# Patient Record
Sex: Female | Born: 1960 | Race: White | Hispanic: No | Marital: Married | Smoking: Never smoker
Health system: Southern US, Community
[De-identification: ages and names within clinical notes are randomized; demographics above are authoritative.]

## PROBLEM LIST (undated history)

## (undated) DIAGNOSIS — R011 Cardiac murmur, unspecified: Secondary | ICD-10-CM

## (undated) DIAGNOSIS — C801 Malignant (primary) neoplasm, unspecified: Secondary | ICD-10-CM

## (undated) DIAGNOSIS — G43909 Migraine, unspecified, not intractable, without status migrainosus: Secondary | ICD-10-CM

## (undated) DIAGNOSIS — Q453 Other congenital malformations of pancreas and pancreatic duct: Secondary | ICD-10-CM

## (undated) DIAGNOSIS — K219 Gastro-esophageal reflux disease without esophagitis: Secondary | ICD-10-CM

## (undated) HISTORY — PX: NASAL SINUS SURGERY: SHX719

## (undated) HISTORY — PX: ABDOMINAL HYSTERECTOMY: SHX81

---

## 2003-10-03 ENCOUNTER — Other Ambulatory Visit: Payer: Self-pay

## 2004-04-24 ENCOUNTER — Other Ambulatory Visit: Payer: Self-pay

## 2005-01-12 ENCOUNTER — Ambulatory Visit: Payer: Self-pay | Admitting: Obstetrics and Gynecology

## 2007-05-09 ENCOUNTER — Ambulatory Visit: Payer: Self-pay | Admitting: Obstetrics and Gynecology

## 2007-06-19 ENCOUNTER — Ambulatory Visit: Payer: Self-pay | Admitting: Unknown Physician Specialty

## 2007-09-29 ENCOUNTER — Inpatient Hospital Stay: Payer: Self-pay | Admitting: Internal Medicine

## 2007-09-29 ENCOUNTER — Other Ambulatory Visit: Payer: Self-pay

## 2008-07-02 ENCOUNTER — Ambulatory Visit: Payer: Self-pay | Admitting: Obstetrics and Gynecology

## 2008-07-15 ENCOUNTER — Ambulatory Visit: Payer: Self-pay | Admitting: Obstetrics and Gynecology

## 2008-07-20 ENCOUNTER — Ambulatory Visit: Payer: Self-pay | Admitting: Obstetrics and Gynecology

## 2009-01-26 ENCOUNTER — Inpatient Hospital Stay: Payer: Self-pay | Admitting: Internal Medicine

## 2009-10-05 ENCOUNTER — Ambulatory Visit: Payer: Self-pay | Admitting: Obstetrics and Gynecology

## 2009-10-09 ENCOUNTER — Ambulatory Visit: Payer: Self-pay | Admitting: Internal Medicine

## 2009-11-01 ENCOUNTER — Ambulatory Visit: Payer: Self-pay | Admitting: Unknown Physician Specialty

## 2010-09-09 ENCOUNTER — Ambulatory Visit: Payer: Self-pay | Admitting: Family Medicine

## 2010-09-21 ENCOUNTER — Ambulatory Visit: Payer: Self-pay | Admitting: Unknown Physician Specialty

## 2010-09-24 ENCOUNTER — Emergency Department: Payer: Self-pay | Admitting: Emergency Medicine

## 2010-09-30 ENCOUNTER — Ambulatory Visit: Payer: Self-pay | Admitting: Unknown Physician Specialty

## 2010-10-05 LAB — PATHOLOGY REPORT

## 2010-11-08 ENCOUNTER — Ambulatory Visit: Payer: Self-pay | Admitting: Internal Medicine

## 2010-11-17 ENCOUNTER — Ambulatory Visit: Payer: Self-pay | Admitting: Obstetrics and Gynecology

## 2011-07-11 ENCOUNTER — Ambulatory Visit: Payer: Self-pay | Admitting: Otolaryngology

## 2011-08-08 ENCOUNTER — Ambulatory Visit: Payer: Self-pay

## 2011-08-24 ENCOUNTER — Ambulatory Visit: Payer: Self-pay

## 2011-11-21 ENCOUNTER — Ambulatory Visit: Payer: Self-pay | Admitting: Family Medicine

## 2011-12-03 ENCOUNTER — Emergency Department (HOSPITAL_COMMUNITY)
Admission: EM | Admit: 2011-12-03 | Discharge: 2011-12-04 | Disposition: A | Discharge status: Home / Self Care | Payer: Worker's Compensation | Attending: Emergency Medicine | Admitting: Emergency Medicine

## 2011-12-03 ENCOUNTER — Emergency Department (HOSPITAL_COMMUNITY): Payer: Worker's Compensation

## 2011-12-03 ENCOUNTER — Encounter (HOSPITAL_COMMUNITY): Payer: Self-pay | Admitting: *Deleted

## 2011-12-03 DIAGNOSIS — S52123A Displaced fracture of head of unspecified radius, initial encounter for closed fracture: Secondary | ICD-10-CM | POA: Insufficient documentation

## 2011-12-03 DIAGNOSIS — Y9301 Activity, walking, marching and hiking: Secondary | ICD-10-CM | POA: Insufficient documentation

## 2011-12-03 DIAGNOSIS — Y998 Other external cause status: Secondary | ICD-10-CM | POA: Insufficient documentation

## 2011-12-03 DIAGNOSIS — W010XXA Fall on same level from slipping, tripping and stumbling without subsequent striking against object, initial encounter: Secondary | ICD-10-CM | POA: Insufficient documentation

## 2011-12-03 MED ORDER — OXYCODONE-ACETAMINOPHEN 5-325 MG PO TABS
2.0000 | ORAL_TABLET | Freq: Once | ORAL | Status: DC
  Filled 2011-12-03: qty 2

## 2011-12-03 MED ORDER — HYDROCODONE-ACETAMINOPHEN 5-325 MG PO TABS
2.0000 | ORAL_TABLET | Freq: Once | ORAL | Status: AC
  Administered 2011-12-03: 1 via ORAL
  Filled 2011-12-03: qty 2

## 2011-12-03 NOTE — ED Notes (Signed)
Pt states that she fell this evening when trying to step over an object and she landed on her left arm. Pt states that she has limited movement and that she has pain all the way up to her shoulder. Pt holding arm like sling and has sharp burst of pain when attempting to move. Pt denies hitting her head or passing out or LOC.

## 2011-12-03 NOTE — Progress Notes (Signed)
Orthopedic Tech Progress Note Patient Details:  Margaret Bryan October 13, 1960 [id number]  Other Ortho Devices Ortho Device Interventions: Application         Sling   For     Right  Arm      Irish Lack 12/03/2011, 11:41 PM

## 2011-12-03 NOTE — ED Notes (Signed)
Pt fell on her left elbow.  Pain in left elbow and unable to move.  Mechanical fall

## 2011-12-04 MED ORDER — HYDROCODONE-ACETAMINOPHEN 5-325 MG PO TABS: 2.0000 | ORAL_TABLET | Freq: Once | ORAL | Status: AC

## 2011-12-04 NOTE — ED Provider Notes (Signed)
Medical screening examination/treatment/procedure(s) were performed by non-physician practitioner and as supervising physician I was immediately available for consultation/collaboration.   Glynn Octave, MD 12/04/11 [phone]

## 2011-12-04 NOTE — Discharge Instructions (Signed)
Cast or Splint Care Casts and splints support injured limbs and keep bones from moving while they heal.  HOME CARE  Keep the cast or splint uncovered during the drying period.   A plaster cast can take 24 to 48 hours to dry.   A fiberglass cast will dry in less than 1 hour.   Do not rest the cast on anything harder than a pillow for 24 hours.   Do not put weight on your injured limb. Do not put pressure on the cast. Wait for your doctor's approval.   Keep the cast or splint dry.   Cover the cast or splint with a plastic bag during baths or wet weather.   If you have a cast over your chest and belly (trunk), take sponge baths until the cast is taken off.   Keep your cast or splint clean. Wash a dirty cast with a damp cloth.   Do not put any objects under your cast or splint. Do not scratch the skin under the cast with an object.   Do not take out the padding from inside your cast.   Exercise your joints near the cast as told by your doctor.   Raise (elevate) your injured limb on 1 or 2 pillows for the first 1 to 3 days.  GET HELP RIGHT AWAY IF:  Your cast or splint cracks.   Your cast or splint is too tight or too loose.   You itch badly under the cast.   Your cast gets wet or has a soft spot.   You have a bad smell coming from the cast.   You get an object stuck under the cast.   Your skin around the cast becomes red or raw.   You have new or more pain after the cast is put on.   You have fluid leaking through the cast.   You cannot move your fingers or toes.   Your fingers or toes turn colors or are cool, painful, or puffy (swollen).   You have tingling or lose feeling (numbness) around the injured area.   You have pain or pressure under the cast.   You have trouble breathing or have shortness of breath.   You have chest pain.  MAKE SURE YOU:  Understand these instructions.   Will watch your condition.   Will get help right away if you are not doing  well or get worse.  Document Released: 12/28/2010 Document Revised: 08/17/2011 Document Reviewed: 12/28/2010 [facility] Patient Information 2012 [place], [place].Elbow Fracture, Simple A fracture is a break in one of the bones.When fractures are not displaced or separated, they may be treated with only a sling or splint. The sling or splint may only be required for two to three weeks. In these cases, often the elbow is put through early range of motion exercises to prevent the elbow from getting stiff. DIAGNOSIS  The diagnosis (learning what is wrong) of a fractured elbow is made by x-ray. These may be required before and after the elbow is put into a splint or cast. X-rays are taken after to make sure the bone pieces have not moved. HOME CARE INSTRUCTIONS   Only take over-the-counter or prescription medicines for pain, discomfort, or fever as directed by your caregiver.   If you have a splint held on with an elastic wrap and your hand or fingers become numb or cold and blue, loosen the wrap and reapply more loosely. See your caregiver if there is no relief.  You may use ice for twenty minutes, four times per day, for the first two to three days.   Use your elbow as directed.   See your caregiver as directed. It is very important to keep all follow-up referrals and appointments in order to avoid any long-term problems with your elbow including chronic pain or stiffness.  SEEK IMMEDIATE MEDICAL CARE IF:   There is swelling or increasing pain in elbow.   You begin to lose feeling or experience numbness or tingling in your hand or fingers.   You develop swelling of the hand and fingers.   You get a cold or blue hand or fingers on affected side.  MAKE SURE YOU:   Understand these instructions.   Will watch your condition.   Will get help right away if you are not doing well or get worse.  Document Released: 08/22/2001 Document Revised: 08/17/2011 Document Reviewed: 07/13/2009 [facility]  Patient Information 2012 [place], [place].

## 2011-12-04 NOTE — ED Provider Notes (Signed)
History     CSN: [id number]  Arrival date & time 12/03/11  2125   First MD Initiated Contact with Patient 12/03/11 2335      Chief Complaint  Patient presents with  . Arm Pain    (Consider location/radiation/quality/duration/timing/severity/associated sxs/prior treatment) HPI Comments: Patient here with left elbow pain after tripping over a small object on the floor and landing on left elbow - reports severe pain in the elbow and inability to straighten the elbow.  She report no wrist pain, numbess or tingling.  She is from [place] and states that they went to [place] and were told the wait was 6 hours so they came here.  Patient is a 51 y.o. female presenting with arm pain. The history is provided by the patient. No language interpreter was used.  Arm Pain This is a new problem. The current episode started today. The problem occurs constantly. The problem has been unchanged. Associated symptoms include arthralgias. Pertinent negatives include no abdominal pain, anorexia, change in bowel habit, chest pain, chills, congestion, coughing, diaphoresis, fatigue, fever, headaches, joint swelling, myalgias, nausea, neck pain, numbness, rash, sore throat, swollen glands, urinary symptoms, vertigo, visual change, vomiting or weakness. The symptoms are aggravated by bending. She has tried nothing for the symptoms. The treatment provided no relief.    History reviewed. No pertinent past medical history.  Past Surgical History  Procedure Date  . Abdominal hysterectomy     No family history on file.  History  Substance Use Topics  . Smoking status: Not on file  . Smokeless tobacco: Not on file  . Alcohol Use: No    OB History    Grav Para Term Preterm Abortions TAB SAB Ect Mult Living                  Review of Systems  Constitutional: Negative for fever, chills, diaphoresis and fatigue.  HENT: Negative for congestion, sore throat and neck pain.   Respiratory: Negative for  cough.   Cardiovascular: Negative for chest pain.  Gastrointestinal: Negative for nausea, vomiting, abdominal pain, anorexia and change in bowel habit.  Musculoskeletal: Positive for arthralgias. Negative for myalgias and joint swelling.  Skin: Negative for rash.  Neurological: Negative for vertigo, weakness, numbness and headaches.  All other systems reviewed and are negative.    Allergies  Percocet  Home Medications   Current Outpatient Rx  Name Route Sig Dispense Refill  . VITAMIN D-3 PO Oral Take 1 capsule by mouth daily.    Marland Kitchen ESOMEPRAZOLE MAGNESIUM 40 MG PO CPDR Oral Take 40 mg by mouth daily before breakfast.    . IBUPROFEN 200 MG PO TABS Oral Take 600 mg by mouth every 6 (six) hours as needed. For pain    . TOPIRAMATE 100 MG PO TABS Oral Take 100 mg by mouth daily.      BP 136/79  Pulse 75  Temp(Src) 97.7 F (36.5 C) (Oral)  Resp 18  SpO2 100%  Physical Exam  Nursing note and vitals reviewed. Constitutional: She is oriented to person, place, and time. She appears well-developed and well-nourished. No distress.  HENT:  Head: Normocephalic and atraumatic.  Right Ear: External ear normal.  Left Ear: External ear normal.  Nose: Nose normal.  Mouth/Throat: Oropharynx is clear and moist. No oropharyngeal exudate.  Eyes: Conjunctivae are normal. Pupils are equal, round, and reactive to light. No scleral icterus.  Neck: Normal range of motion. Neck supple.  Cardiovascular: Normal rate, regular rhythm and normal heart sounds.  Exam reveals no gallop and no friction rub.   No murmur heard. Pulmonary/Chest: Breath sounds normal. No respiratory distress. She has no wheezes. She has no rales. She exhibits no tenderness.  Abdominal: Soft. Bowel sounds are normal. She exhibits no distension. There is no tenderness.  Musculoskeletal:       Left elbow: She exhibits decreased range of motion, swelling and effusion. She exhibits no deformity and no laceration. tenderness found.  Radial head tenderness noted.  Lymphadenopathy:    She has no cervical adenopathy.  Neurological: She is alert and oriented to person, place, and time. No cranial nerve deficit.  Skin: Skin is warm and dry. No rash noted. No erythema. No pallor.  Psychiatric: She has a normal mood and affect. Her behavior is normal. Judgment and thought content normal.    ED Course  Procedures (including critical care time)  Labs Reviewed - No data to display Dg Elbow Complete Left  12/03/2011  *RADIOLOGY REPORT*  Clinical Data: Larey Seat on left elbow; left elbow pain and limited range of motion.  LEFT ELBOW - COMPLETE 3+ VIEW  Comparison: None.  Findings: A minimally displaced radial head fracture is suspected, though not well characterized, given the patient's large elbow joint effusion.  No definite distal humeral fracture is characterized.  The joint space is otherwise grossly preserved.  No additional soft tissue abnormalities are seen.  IMPRESSION: Large elbow joint effusion noted; suspect minimally displaced radial head fracture, though this is not well characterized.  Original Report Authenticated By: Tonia Ghent, M.D.     Radial head fracture    MDM  Patient here with radial head fracture, placed in sugar tong splint and sling, she is from [place] and will follow up with an orthopedic surgeon there - given pain control here.        Izola Price [place], [place] 12/04/11 [phone]

## 2012-01-15 ENCOUNTER — Encounter: Payer: Self-pay | Admitting: Orthopedic Surgery

## 2012-02-10 ENCOUNTER — Encounter: Payer: Self-pay | Admitting: Orthopedic Surgery

## 2012-05-26 ENCOUNTER — Ambulatory Visit: Payer: Self-pay | Admitting: Medical

## 2012-08-30 ENCOUNTER — Emergency Department (HOSPITAL_COMMUNITY)
Admission: EM | Admit: 2012-08-30 | Discharge: 2012-08-30 | Disposition: A | Discharge status: Home / Self Care | Payer: No Typology Code available for payment source | Attending: Emergency Medicine | Admitting: Emergency Medicine

## 2012-08-30 ENCOUNTER — Encounter (HOSPITAL_COMMUNITY): Payer: Self-pay

## 2012-08-30 ENCOUNTER — Emergency Department (HOSPITAL_COMMUNITY): Payer: No Typology Code available for payment source

## 2012-08-30 DIAGNOSIS — R0789 Other chest pain: Secondary | ICD-10-CM | POA: Insufficient documentation

## 2012-08-30 DIAGNOSIS — Z79899 Other long term (current) drug therapy: Secondary | ICD-10-CM | POA: Insufficient documentation

## 2012-08-30 DIAGNOSIS — R011 Cardiac murmur, unspecified: Secondary | ICD-10-CM | POA: Insufficient documentation

## 2012-08-30 DIAGNOSIS — G43909 Migraine, unspecified, not intractable, without status migrainosus: Secondary | ICD-10-CM | POA: Insufficient documentation

## 2012-08-30 HISTORY — DX: Migraine, unspecified, not intractable, without status migrainosus: G43.909

## 2012-08-30 HISTORY — DX: Cardiac murmur, unspecified: R01.1

## 2012-08-30 LAB — CBC
Hemoglobin: 13.1 g/dL (ref 12.0–15.0)
MCV: 90.9 fL (ref 78.0–100.0)
Platelets: 220 10*3/uL (ref 150–400)
RBC: 4.38 MIL/uL (ref 3.87–5.11)
WBC: 4.8 10*3/uL (ref 4.0–10.5)

## 2012-08-30 LAB — BASIC METABOLIC PANEL
CO2: 24 mEq/L (ref 19–32)
Chloride: 106 mEq/L (ref 96–112)
Sodium: 140 mEq/L (ref 135–145)

## 2012-08-30 LAB — PRO B NATRIURETIC PEPTIDE: Pro B Natriuretic peptide (BNP): 19.6 pg/mL (ref 0–125)

## 2012-08-30 LAB — TROPONIN I: Troponin I: <0.3 ng/mL (ref <0.30)

## 2012-08-30 LAB — D-DIMER, QUANTITATIVE: D-Dimer, Quant: 0.39 ug/mL-FEU (ref 0.00–0.48)

## 2012-08-30 LAB — POCT I-STAT TROPONIN I: Troponin i, poc: 0 ng/mL (ref 0.00–0.08)

## 2012-08-30 NOTE — ED Notes (Signed)
Pt reports sudden onset of (L) side chest pain radiating into her (L) arm and up into (L) side of her neck, bilateral shoulder aches, SOB w/exertion, and "a lot of belching." pt reports symptoms started after lunch and originally thought it was gas. Pt in nad, describes her pain as a pressure

## 2012-08-30 NOTE — ED Provider Notes (Signed)
History     CSN: [id number]  Arrival date & time 08/30/12  4098   First MD Initiated Contact with Patient 08/30/12 1950      Chief Complaint  Patient presents with  . Chest Pain    (Consider location/radiation/quality/duration/timing/severity/associated sxs/prior treatment) HPI Comments: Patient reports left-sided chest pain onset this afternoon around 2 PM. He comes and goes lasting a few seconds to minutes at a time. It radiates to her left arm. She is not have panic this in the past. She denies any shortness of breath, vomiting, cough or fever. She reports no previous cardiac history and had a negative stress test 2 years ago. She's not smoke. She's does not have hypertension high blood pressure diabetes. She is pain-free right now. Pain is not reproducible. It does not last longer than a couple minutes. It has been coming and going for the past 6 hours however.  The history is provided by the patient and the spouse.    Past Medical History  Diagnosis Date  . Heart murmur   . Migraine     Past Surgical History  Procedure Date  . Abdominal hysterectomy     Family History  Problem Relation Age of Onset  . Heart failure Mother   . Hypertension Mother   . Heart failure Father   . Hypertension Father     History  Substance Use Topics  . Smoking status: Never Smoker   . Smokeless tobacco: Not on file  . Alcohol Use: No    OB History    Grav Para Term Preterm Abortions TAB SAB Ect Mult Living                  Review of Systems  Constitutional: Negative for fever, activity change and appetite change.  HENT: Negative for congestion and rhinorrhea.   Respiratory: Negative for cough and shortness of breath.   Cardiovascular: Positive for chest pain.  Gastrointestinal: Negative for nausea, vomiting and abdominal pain.  Genitourinary: Negative for dysuria, hematuria, vaginal bleeding and vaginal discharge.  Musculoskeletal: Negative for back pain.  Skin: Negative  for rash.  Neurological: Negative for dizziness, weakness and headaches.  A complete 10 system review of systems was obtained and all systems are negative except as noted in the HPI and PMH.    Allergies  Percocet  Home Medications   Current Outpatient Rx  Name  Route  Sig  Dispense  Refill  . TOPIRAMATE 100 MG PO TABS   Oral   Take 100 mg by mouth at bedtime.            BP 98/61  Pulse 67  Temp 97.6 F (36.4 C) (Oral)  Resp 15  SpO2 99%  Physical Exam  Constitutional: She is oriented to person, place, and time. She appears well-developed and well-nourished. No distress.  HENT:  Head: Normocephalic and atraumatic.  Mouth/Throat: Oropharynx is clear and moist. No oropharyngeal exudate.  Eyes: Conjunctivae normal and EOM are normal. Pupils are equal, round, and reactive to light.  Neck: Normal range of motion. Neck supple.  Cardiovascular: Normal rate, regular rhythm and normal heart sounds.   No murmur heard. Pulmonary/Chest: Effort normal and breath sounds normal. No respiratory distress. She exhibits no tenderness.  Abdominal: Soft. There is no tenderness. There is no rebound and no guarding.  Musculoskeletal: Normal range of motion. She exhibits no edema.  Neurological: She is alert and oriented to person, place, and time. No cranial nerve deficit. She exhibits normal muscle tone.  Coordination normal.  Skin: Skin is warm.    ED Course  Procedures (including critical care time)  Labs Reviewed  BASIC METABOLIC PANEL - Abnormal; Notable for the following:    GFR calc non Af Amer 84 (*)     All other components within normal limits  CBC  PRO B NATRIURETIC PEPTIDE  POCT I-STAT TROPONIN I  TROPONIN I  D-DIMER, QUANTITATIVE   Dg Chest 2 View  08/30/2012  *RADIOLOGY REPORT*  Clinical Data: Chest and bilateral arm pain.  CHEST - 2 VIEW  Comparison: None.  Findings: Lungs clear.  Heart size normal.  No pneumothorax or pleural fluid.  IMPRESSION: Negative chest.    Original Report Authenticated By: Holley Dexter, M.D.      No diagnosis found.    MDM  Intermittent chest pain for the past 6-1/2 hours. No shortness of breath, nausea diaphoresis. EKG nonischemic, troponin negative.  Atypical chest pain lasting for a few seconds to minutes at a time that radiates to left arm. No shortness of breath, nausea, diaphoresis or dizziness.  EKG normal sinus rhythm. Troponin negative. D-dimer negative. Delta troponin negative. Patient pain-free at this time.  She is offered CDU chest pain protocol. She rather followup with her doctor for outpatient stress test. I believe this is reasonable with two negative troponins and a normal EKG. Patient and husband in agreement.     Date: 08/30/2012  Rate: 75  Rhythm: normal sinus rhythm  QRS Axis: normal  Intervals: normal  ST/T Wave abnormalities: ST depressions inferiorly  Conduction Disutrbances:none  Narrative Interpretation: Possible slight ST depressions inferiorly  Old EKG Reviewed: none available   Date: 08/30/2012  Rate: 67  Rhythm: normal sinus rhythm  QRS Axis: normal  Intervals: normal  ST/T Wave abnormalities: normal  Conduction Disutrbances:none  Narrative Interpretation:   Old EKG Reviewed: unchanged    Glynn Octave, MD 08/30/12 2321

## 2013-02-14 ENCOUNTER — Ambulatory Visit: Payer: Self-pay | Admitting: Unknown Physician Specialty

## 2013-02-17 LAB — PATHOLOGY REPORT

## 2013-03-05 ENCOUNTER — Ambulatory Visit: Payer: Self-pay

## 2013-04-23 ENCOUNTER — Ambulatory Visit: Payer: Self-pay | Admitting: Family Medicine

## 2013-07-08 ENCOUNTER — Ambulatory Visit: Payer: Self-pay | Admitting: Family Medicine

## 2013-07-08 LAB — URINALYSIS, COMPLETE
Bacteria: NEGATIVE
Bilirubin,UR: NEGATIVE
Glucose,UR: NEGATIVE mg/dL (ref 0–75)
Ketone: NEGATIVE
Nitrite: NEGATIVE
Ph: 7.5 (ref 4.5–8.0)
Protein: NEGATIVE
Specific Gravity: 1.01 (ref 1.003–1.030)

## 2013-07-08 LAB — CBC WITH DIFFERENTIAL/PLATELET
Basophil #: 0 10*3/uL (ref 0.0–0.1)
Basophil %: 0.8 %
Eosinophil #: 0.1 10*3/uL (ref 0.0–0.7)
Eosinophil %: 2.2 %
HCT: 40.7 % (ref 35.0–47.0)
HGB: 13.4 g/dL (ref 12.0–16.0)
Lymphocyte %: 39.1 %
MCV: 91 fL (ref 80–100)
Monocyte #: 0.4 x10 3/mm (ref 0.2–0.9)
Monocyte %: 6.8 %
Platelet: 224 10*3/uL (ref 150–440)
RBC: 4.45 10*6/uL (ref 3.80–5.20)
RDW: 13 % (ref 11.5–14.5)
WBC: 5.5 10*3/uL (ref 3.6–11.0)

## 2013-07-08 LAB — COMPREHENSIVE METABOLIC PANEL WITH GFR
Albumin: 4 g/dL
Alkaline Phosphatase: 106 U/L
Anion Gap: 12
BUN: 17 mg/dL
Bilirubin,Total: 0.3 mg/dL
Calcium, Total: 8.7 mg/dL
Chloride: 104 mmol/L
Co2: 26 mmol/L
Creatinine: 0.81 mg/dL
EGFR (African American): >60
EGFR (Non-African Amer.): >60
Glucose: 94 mg/dL
Osmolality: 284
Potassium: 3.8 mmol/L
SGOT(AST): 17 U/L
SGPT (ALT): 29 U/L
Sodium: 142 mmol/L
Total Protein: 7.4 g/dL

## 2013-07-08 LAB — MONONUCLEOSIS SCREEN: Mono Test: NEGATIVE

## 2013-10-09 ENCOUNTER — Ambulatory Visit: Payer: Self-pay | Admitting: Physician Assistant

## 2013-11-09 ENCOUNTER — Ambulatory Visit: Payer: Self-pay | Admitting: Emergency Medicine

## 2013-11-09 LAB — RAPID STREP-A WITH REFLX: Micro Text Report: NEGATIVE

## 2013-11-12 LAB — BETA STREP CULTURE(ARMC)

## 2013-12-22 IMAGING — CR DG ELBOW COMPLETE 3+V*L*
4 series · 4 of 4 positions shown · non-contrast
Comparison: None.

CLINICAL DATA: Fell on left elbow; left elbow pain and limited
range of motion.

LEFT ELBOW - COMPLETE 3+ VIEW

[x elbow joint obl. left (1 of 2)]
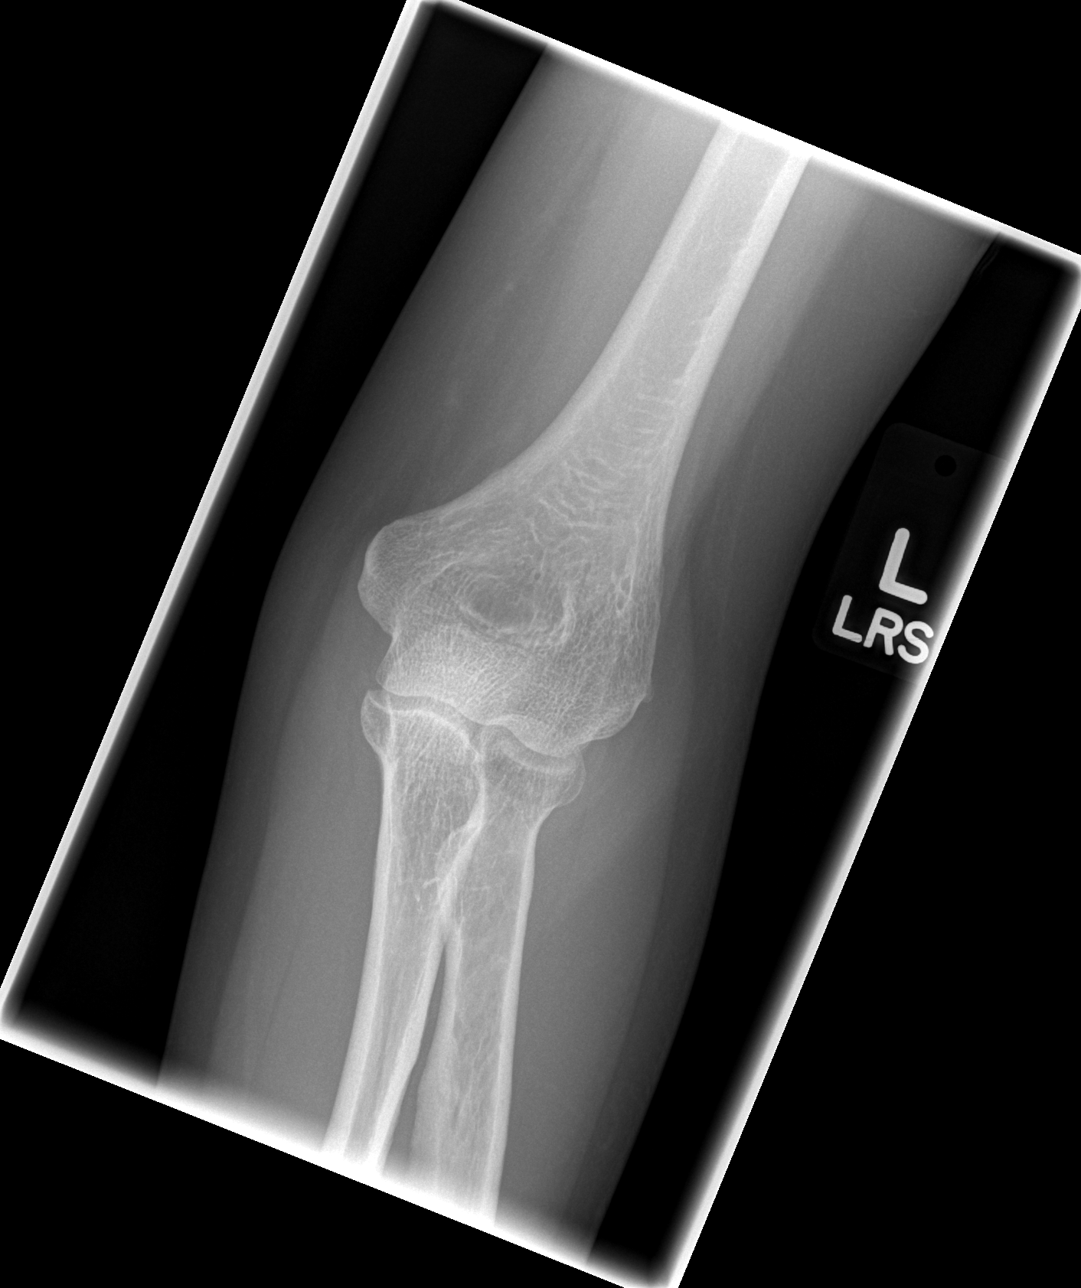

[x elbow joint lat left (1 of 2)]
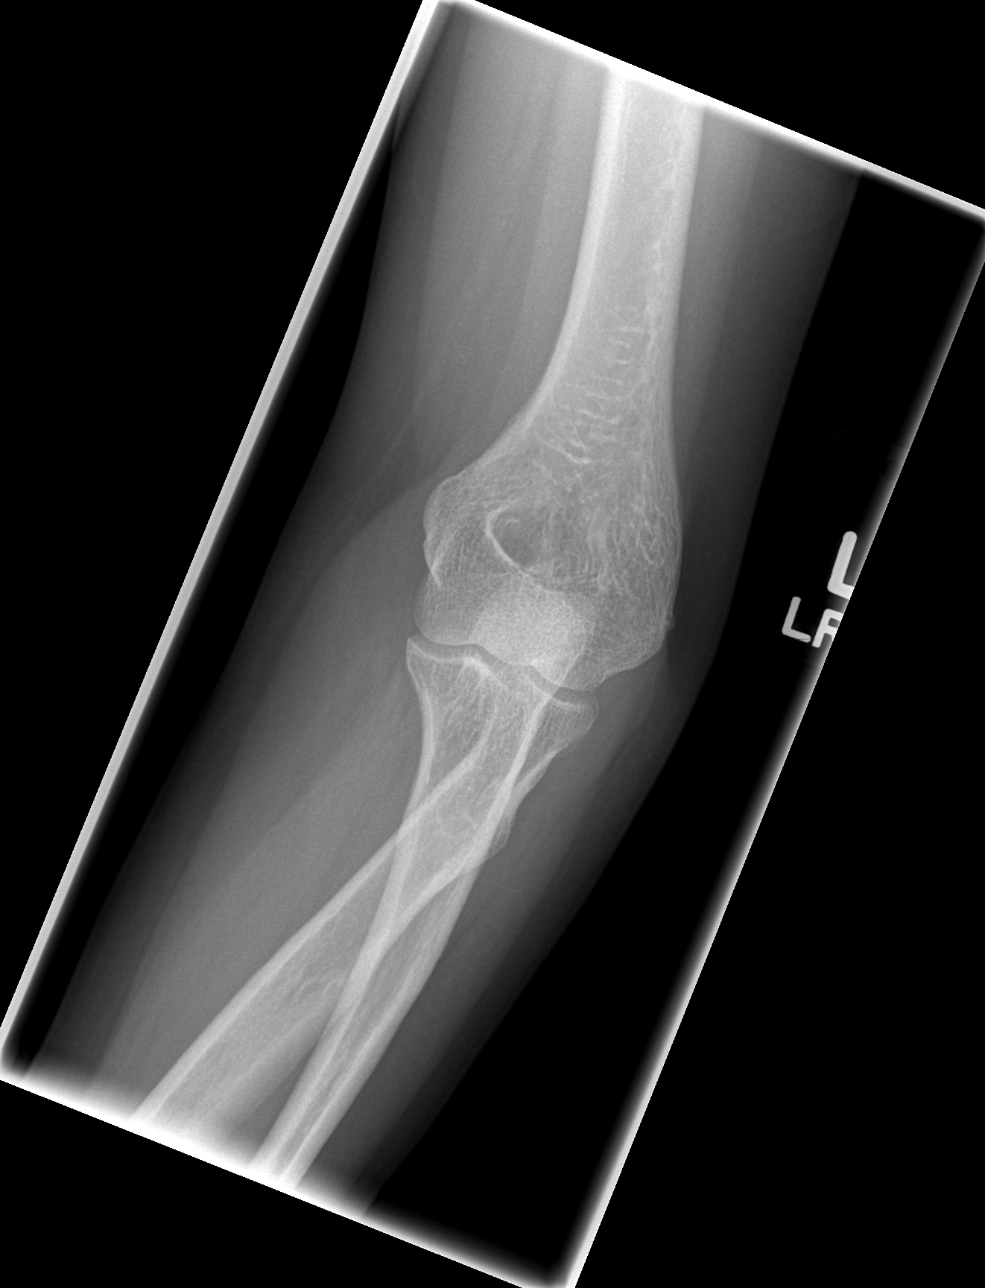

[x elbow joint obl. left (2 of 2)]
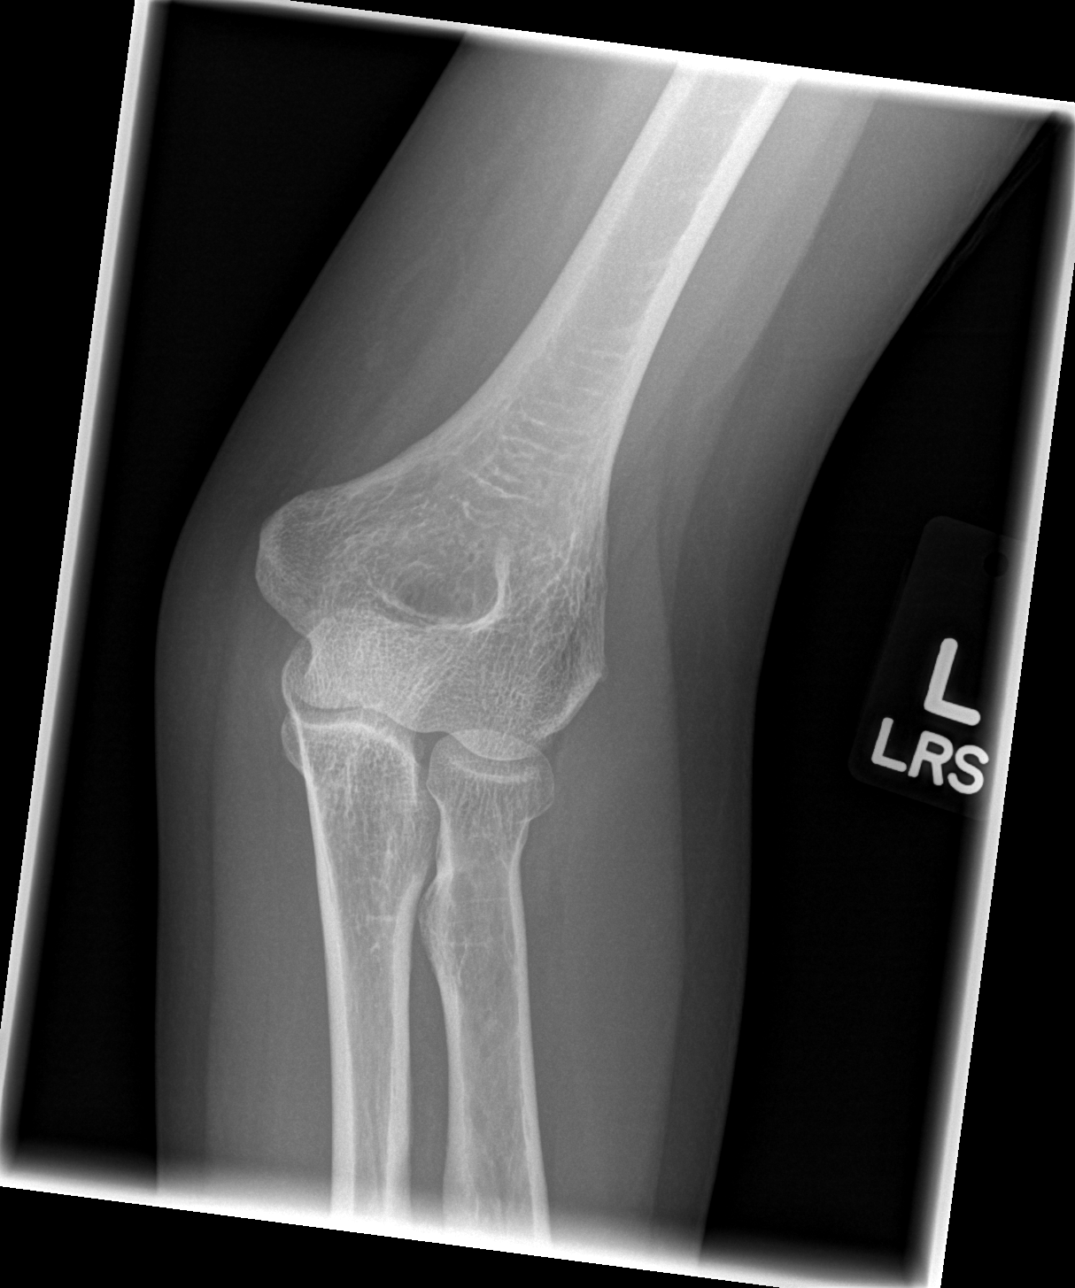

[x elbow joint lat left (2 of 2)]
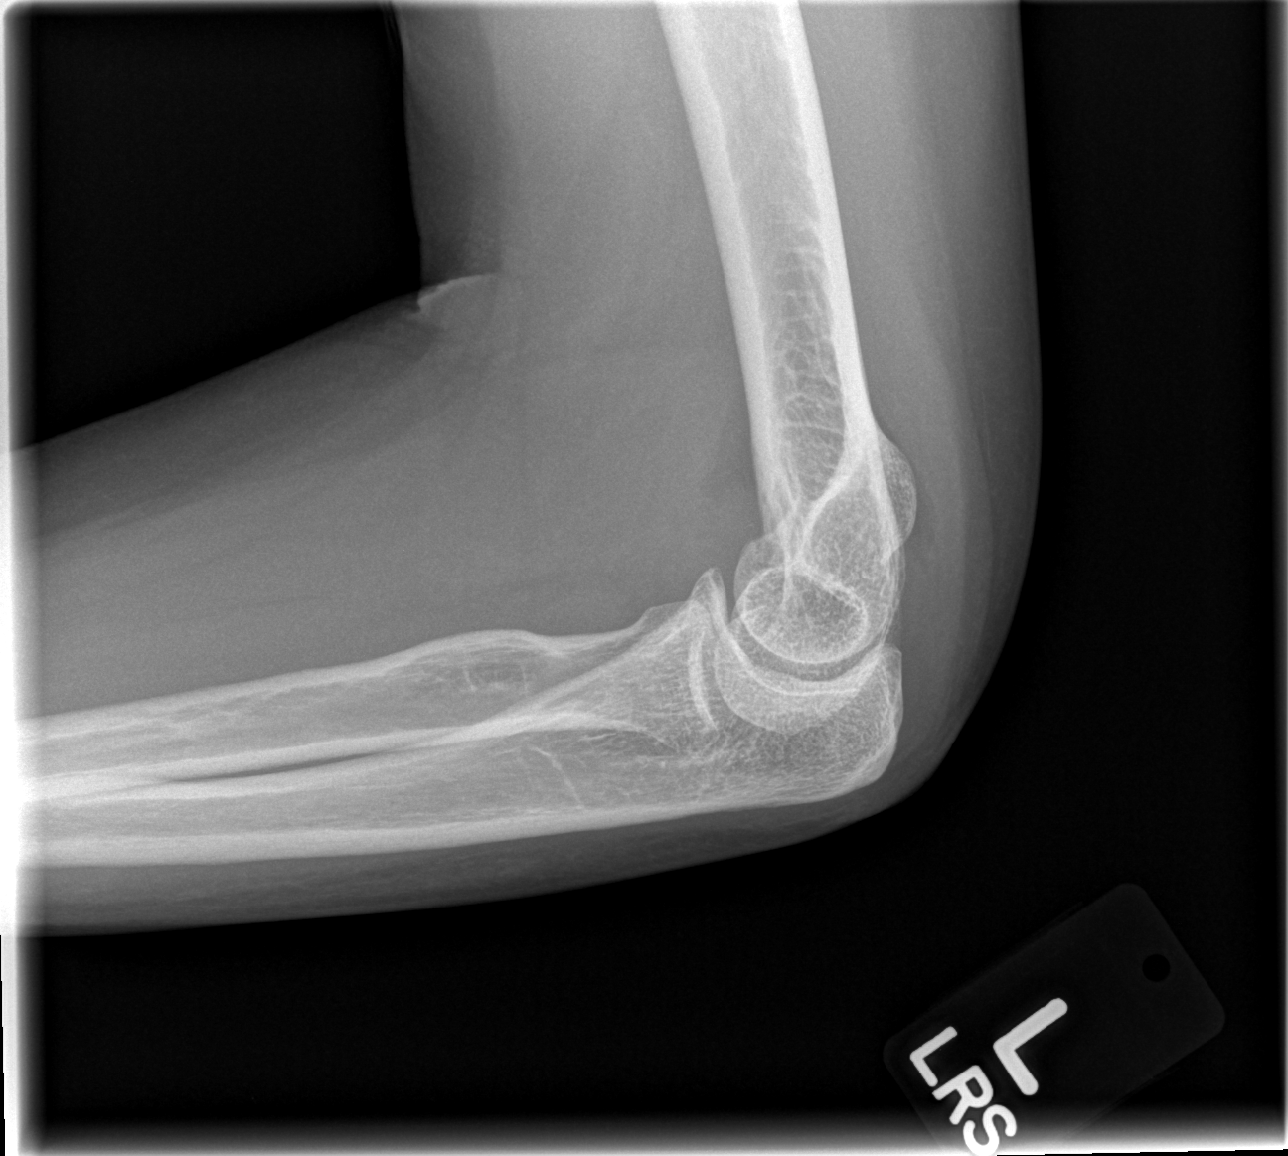

[4 of 4 positions shown; findings below may reference images not displayed]

FINDINGS: A minimally displaced radial head fracture is suspected,
though not well characterized, given the patient's large elbow
joint effusion.  No definite distal humeral fracture is
characterized.  The joint space is otherwise grossly preserved.  No
additional soft tissue abnormalities are seen.
IMPRESSION: Large elbow joint effusion noted; suspect minimally displaced
radial head fracture, though this is not well characterized.

## 2013-12-31 ENCOUNTER — Ambulatory Visit: Payer: Self-pay | Admitting: Otolaryngology

## 2014-05-20 ENCOUNTER — Ambulatory Visit: Payer: Self-pay | Admitting: Family Medicine

## 2014-12-10 ENCOUNTER — Ambulatory Visit: Admit: 2014-12-10 | Disposition: A | Discharge status: Home / Self Care | Payer: Self-pay | Admitting: Neurology

## 2015-02-23 ENCOUNTER — Other Ambulatory Visit: Payer: Self-pay | Admitting: Neurology

## 2015-02-23 DIAGNOSIS — R200 Anesthesia of skin: Secondary | ICD-10-CM

## 2015-02-23 DIAGNOSIS — M542 Cervicalgia: Secondary | ICD-10-CM

## 2015-03-02 ENCOUNTER — Ambulatory Visit
Admission: RE | Admit: 2015-03-02 | Discharge: 2015-03-02 | Disposition: A | Discharge status: Home / Self Care | Payer: No Typology Code available for payment source | Source: Ambulatory Visit | Attending: Neurology | Admitting: Neurology

## 2015-03-02 DIAGNOSIS — R200 Anesthesia of skin: Secondary | ICD-10-CM | POA: Insufficient documentation

## 2015-03-02 DIAGNOSIS — M5032 Other cervical disc degeneration, mid-cervical region: Secondary | ICD-10-CM | POA: Diagnosis not present

## 2015-03-02 DIAGNOSIS — M542 Cervicalgia: Secondary | ICD-10-CM

## 2015-03-02 MED ORDER — GADOBENATE DIMEGLUMINE 529 MG/ML IV SOLN
20.0000 mL | Freq: Once | INTRAVENOUS | Status: AC | PRN
  Administered 2015-03-02: 16 mL via INTRAVENOUS

## 2015-03-16 ENCOUNTER — Other Ambulatory Visit: Payer: Self-pay | Admitting: Family Medicine

## 2015-03-16 DIAGNOSIS — Z1231 Encounter for screening mammogram for malignant neoplasm of breast: Secondary | ICD-10-CM

## 2015-04-11 ENCOUNTER — Ambulatory Visit
Admission: EM | Admit: 2015-04-11 | Discharge: 2015-04-11 | Disposition: A | Discharge status: Home / Self Care | Payer: 59 | Attending: Emergency Medicine | Admitting: Emergency Medicine

## 2015-04-11 DIAGNOSIS — G43009 Migraine without aura, not intractable, without status migrainosus: Secondary | ICD-10-CM

## 2015-04-11 MED ORDER — KETOROLAC TROMETHAMINE 60 MG/2ML IM SOLN
60.0000 mg | Freq: Once | INTRAMUSCULAR | Status: AC
  Administered 2015-04-11: 60 mg via INTRAMUSCULAR

## 2015-04-11 MED ORDER — ONDANSETRON 8 MG PO TBDP: 8.0000 mg | ORAL_TABLET | Freq: Three times a day (TID) | ORAL | Status: DC | PRN

## 2015-04-11 MED ORDER — ONDANSETRON 8 MG PO TBDP
8.0000 mg | ORAL_TABLET | Freq: Once | ORAL | Status: AC
  Administered 2015-04-11: 4 mg via ORAL

## 2015-04-11 NOTE — ED Provider Notes (Signed)
CSN: [id number]     Arrival date & time 04/11/15  1409 History   First MD Initiated Contact with Patient 04/11/15 1447     Chief Complaint  Patient presents with  . Migraine   (Consider location/radiation/quality/duration/timing/severity/associated sxs/prior Treatment) HPI  54 yo F with long history of migraine headaches. Takes Topomax twice daily . This was not effective in relieving her of the headache she woke up with this morning. Has had AM episodes in past. Attempted to eat breakfast but N/V , lost the pancake. Has had dry heaves throughtout the day. Has not been ill, no particular emotional stresses. Weather changes often affect her. Left sided headache, her usual presentation. No visual changes. No neurosensory symptoms. No dizzines or instability. Nausea and vomiting this AM Past Medical History  Diagnosis Date  . Heart murmur   . Migraine    Past Surgical History  Procedure Laterality Date  . Abdominal hysterectomy     Family History  Problem Relation Age of Onset  . Heart failure Mother   . Hypertension Mother   . Heart failure Father   . Hypertension Father    History  Substance Use Topics  . Smoking status: Never Smoker   . Smokeless tobacco: Not on file  . Alcohol Use: No   OB History    No data available     Review of Systems Constitutional: No fever, left sided migraine h/a Eyes: No visual changes. ENT:No sore throat. Cardiovascular:Negative for chest pain/palpitations Respiratory: Negative for shortness of breath Gastrointestinal: No abdominal pain. Has had nausea,vomiting,   No diarrhea Genitourinary: Negative for dysuria. Normal urination. Musculoskeletal: Negative for back pain. FROM extremities without pain Skin: Negative for rash Neurological: Negative for focal weakness or numbness, no balance issues or dizziness  Allergies  Percocet and Percocet  Home Medications   Prior to Admission medications   Medication Sig Start Date End Date Taking?  Authorizing Provider  ondansetron (ZOFRAN-ODT) 8 MG disintegrating tablet Take 1 tablet (8 mg total) by mouth every 8 (eight) hours as needed for nausea or vomiting. 04/11/15   Jan Fireman, PA-C  topiramate (TOPAMAX) 100 MG tablet Take 100 mg by mouth at bedtime.     Historical Provider, MD   BP 144/88 mmHg  Pulse 65  Temp(Src) 98.1 F (36.7 C) (Oral)  Resp 18  Ht 5\' 8"  (1.727 m)  Wt 179 lb (81.194 kg)  BMI 27.22 kg/m2  SpO2 100% Physical Exam   Constitutional -alert and oriented,nausea, left sided headache. "morning headaches are harder for me to break" Head-atraumatic, normocephalic Eyes- conjunctiva normal, EOMI ,conjugate gaze, PERRLA Nose- no congestion or rhinorrhea Mouth/throat- mucous membranes moist , Neck- supple without glandular enlargement CV- regular rate, grossly normal heart sounds, G2 SEM Resp-no distress, normal respiratory effort,clear to auscultation bilaterally GI- soft,non-tender,no distention GU-  not examined MSK- normal ROM, all extremities, ambulatory, self-care Neuro- normal speech and language, no gross focal neurological deficit appreciated, no gait instability,CNs as tested WNL Skin-warm,dry ,intact; no rash noted Psych-mood and affect grossly normal; speech and behavior grossly normal ED Course  Procedures (including critical care time) Labs Review Labs Reviewed - No data to display  Imaging Review No results found.  Medications  ketorolac (TORADOL) injection 60 mg (60 mg Intramuscular Given 04/11/15 1500)  ondansetron (ZOFRAN-ODT) disintegrating tablet 8 mg (4 mg Oral Given 04/11/15 1500)   Half hour later with improved headache and no more nausea Accepted ice chips, then Coke without recurrent nausea  An hour post Rx  reports feeling greatly improved, headache almost completely gone, requests discharge home No dizziness or neurosensory findings, denies visual changes. MDM   1. Migraine without aura and without status migrainosus, not  intractable    Diagnosis and treatment discussed.Will submit nausea Rx for at home availability. Headaches are longstanding and Topomax Rx maintained . H/A not completely gone at discharge but greatly improved and will go home to rest. Discourage TV, computer and phone screens. Hydrate.  Questions fielded, expectations and recommendations reviewed. Patient expresses understanding. Will return to [facility] with questions, concern or exacerbation.     Jan Fireman, PA-C 04/13/15 [phone]

## 2015-04-11 NOTE — ED Notes (Signed)
MigrainE, woke up this AM with severe headache on left side, no visual changes, Perrl, neuro checks unremarkable, nausea and vomiting present.

## 2015-04-13 ENCOUNTER — Encounter: Payer: Self-pay | Admitting: Physician Assistant

## 2015-05-25 ENCOUNTER — Ambulatory Visit: Payer: No Typology Code available for payment source

## 2015-06-01 ENCOUNTER — Ambulatory Visit
Admission: RE | Admit: 2015-06-01 | Discharge: 2015-06-01 | Disposition: A | Discharge status: Home / Self Care | Payer: 59 | Source: Ambulatory Visit | Attending: Family Medicine | Admitting: Family Medicine

## 2015-06-01 DIAGNOSIS — Z1231 Encounter for screening mammogram for malignant neoplasm of breast: Secondary | ICD-10-CM

## 2016-03-22 ENCOUNTER — Other Ambulatory Visit: Payer: Self-pay | Admitting: Family Medicine

## 2016-03-22 DIAGNOSIS — Z1231 Encounter for screening mammogram for malignant neoplasm of breast: Secondary | ICD-10-CM

## 2016-06-01 ENCOUNTER — Ambulatory Visit
Admission: RE | Admit: 2016-06-01 | Discharge: 2016-06-01 | Disposition: A | Discharge status: Home / Self Care | Payer: 59 | Source: Ambulatory Visit | Attending: Family Medicine | Admitting: Family Medicine

## 2016-06-01 ENCOUNTER — Other Ambulatory Visit: Payer: Self-pay | Admitting: Family Medicine

## 2016-06-01 DIAGNOSIS — Z1231 Encounter for screening mammogram for malignant neoplasm of breast: Secondary | ICD-10-CM

## 2016-07-18 ENCOUNTER — Emergency Department
Admission: EM | Admit: 2016-07-18 | Discharge: 2016-07-18 | Disposition: A | Discharge status: Home / Self Care | Payer: 59 | Attending: Emergency Medicine | Admitting: Emergency Medicine

## 2016-07-18 ENCOUNTER — Emergency Department: Payer: 59

## 2016-07-18 ENCOUNTER — Encounter: Payer: Self-pay | Admitting: Emergency Medicine

## 2016-07-18 DIAGNOSIS — R0789 Other chest pain: Secondary | ICD-10-CM | POA: Insufficient documentation

## 2016-07-18 DIAGNOSIS — Z79899 Other long term (current) drug therapy: Secondary | ICD-10-CM | POA: Insufficient documentation

## 2016-07-18 DIAGNOSIS — R002 Palpitations: Secondary | ICD-10-CM | POA: Insufficient documentation

## 2016-07-18 LAB — CBC
HEMATOCRIT: 39.5 % (ref 35.0–47.0)
HEMOGLOBIN: 13.4 g/dL (ref 12.0–16.0)
MCH: 30.4 pg (ref 26.0–34.0)
MCHC: 33.8 g/dL (ref 32.0–36.0)
MCV: 89.9 fL (ref 80.0–100.0)
Platelets: 228 10*3/uL (ref 150–440)
RBC: 4.4 MIL/uL (ref 3.80–5.20)
RDW: 13 % (ref 11.5–14.5)
WBC: 5.2 10*3/uL (ref 3.6–11.0)

## 2016-07-18 LAB — BASIC METABOLIC PANEL
ANION GAP: 6 (ref 5–15)
BUN: 13 mg/dL (ref 6–20)
CHLORIDE: 104 mmol/L (ref 101–111)
CO2: 29 mmol/L (ref 22–32)
Calcium: 8.8 mg/dL — ABNORMAL LOW (ref 8.9–10.3)
Creatinine, Ser: 0.78 mg/dL (ref 0.44–1.00)
GFR calc Af Amer: >60 mL/min (ref >60)
GFR calc non Af Amer: >60 mL/min (ref >60)
GLUCOSE: 107 mg/dL — AB (ref 65–99)
POTASSIUM: 3.9 mmol/L (ref 3.5–5.1)
Sodium: 139 mmol/L (ref 135–145)

## 2016-07-18 LAB — TROPONIN I: Troponin I: <0.03 ng/mL (ref <0.03)

## 2016-07-18 NOTE — ED Triage Notes (Signed)
Pt reports nausea and chest pressure since last night. States she was nauseous before going to bed and when she laid down she felt like her "heart was racing." Also reports intermittent episodes of shortness of breath, lightheadedness and right jaw pain. Pt denies any personal cardiac history but reports both parents with significant cardiac history.

## 2016-07-18 NOTE — ED Provider Notes (Signed)
-----------------------------------------   8:34 PM on 07/18/2016 -----------------------------------------  I forgot to discharge the patient with a prescription for Zofran as I told her I would.  Our secretary is going to call in a prescription for her for the following: Zofran ODT tablets.  Allow 1 tablet to dissolve in your mouth every 6-8 hours as needed for nausea or vomiting.  Dispensed 30 tablets.  No refills.   Hinda Kehr, MD 07/18/16 2034

## 2016-07-18 NOTE — ED Provider Notes (Signed)
[facility] Emergency Department Provider Note  ____________________________________________   First MD Initiated Contact with Patient 07/18/16 1547     (approximate)  I have reviewed the triage vital signs and the nursing notes.   HISTORY  Chief Complaint Chest Pain and Nausea    HPI Margaret Bryan is a 55 y.o. female with a past medical history of palpitations years ago but no other personal cardiac history who has seen Dr. Nehemiah Massed in the past.  She presents complaining of intermittent episodes of racing heart palpitations since late last night.  They are accompanied with chest tightness, shortness of breath, lightheadedness, and some right jaw pain.  Currently she is not having any of those symptoms except possibly for some persistent chest tightness.  She also had nausea which is intermittent.  Overall she describes the symptoms as moderate to severe.  It feels similar but worse than the episodes that she had years ago for which she saw Dr. Nehemiah Massed and had a Holter monitor placed with no definitive diagnosis provided.  She has no personal history of coronary artery disease although both of her parents had significant coronary artery disease and myocardial infarctions.  She does not smoke, have high blood pressure, hyperlipidemia, or diabetes.  She is generally healthy other than finishing a course of antibiotics about a week and a half ago for mild sinus infection.  Nothing in particular made her symptoms worse or better and they resolved after about 15 minutes.   Past Medical History:  Diagnosis Date  . Heart murmur   . Migraine     There are no active problems to display for this patient.   Past Surgical History:  Procedure Laterality Date  . ABDOMINAL HYSTERECTOMY    . NASAL SINUS SURGERY      Prior to Admission medications   Medication Sig Start Date End Date Taking? Authorizing Provider  ondansetron (ZOFRAN-ODT) 8 MG disintegrating tablet Take  1 tablet (8 mg total) by mouth every 8 (eight) hours as needed for nausea or vomiting. 04/11/15   Jan Fireman, PA-C  topiramate (TOPAMAX) 100 MG tablet Take 100 mg by mouth at bedtime.     Historical Provider, MD    Allergies Percocet [oxycodone-acetaminophen] and Percocet [oxycodone-acetaminophen]  Family History  Problem Relation Age of Onset  . Heart failure Mother   . Hypertension Mother   . Heart failure Father   . Hypertension Father   . Breast cancer Maternal Aunt 35  . Breast cancer Maternal Grandmother 41  . Breast cancer Cousin 20    Social History Social History  Substance Use Topics  . Smoking status: Never Smoker  . Smokeless tobacco: Never Used  . Alcohol use No    Review of Systems Constitutional: No fever/chills Eyes: No visual changes. ENT: No sore throat. Cardiovascular: Chest tightness associated with racing heart palpitations, intermittent episodes since last night Respiratory: Denies shortness of breath. Gastrointestinal: No abdominal pain.  nausea, no vomiting.  No diarrhea.  No constipation. Genitourinary: Negative for dysuria. Musculoskeletal: Negative for back pain. Skin: Negative for rash. Neurological: Negative for headaches, focal weakness or numbness.  10-point ROS otherwise negative.  ____________________________________________   PHYSICAL EXAM:  VITAL SIGNS: ED Triage Vitals  Enc Vitals Group     BP 07/18/16 1356 (!) 148/77     Pulse Rate 07/18/16 1356 71     Resp 07/18/16 1356 16     Temp 07/18/16 1356 98.1 F (36.7 C)     Temp Source 07/18/16  1356 Oral     SpO2 07/18/16 1356 97 %     Weight 07/18/16 1357 210 lb (95.3 kg)     Height 07/18/16 1357 5\' 8"  (1.727 m)     Head Circumference --      Peak Flow --      Pain Score 07/18/16 1357 6     Pain Loc --      Pain Edu? --      Excl. in [place]? --     Constitutional: Alert and oriented. Well appearing and in no acute distress. Eyes: Conjunctivae are normal. PERRL. EOMI. Head:  Atraumatic. Nose: No congestion/rhinnorhea. Mouth/Throat: Mucous membranes are moist.  Oropharynx non-erythematous. Neck: No stridor.  No meningeal signs.   Cardiovascular: Normal rate, regular rhythm. Good peripheral circulation. Grossly normal heart sounds. Respiratory: Normal respiratory effort.  No retractions. Lungs CTAB. Gastrointestinal: Soft and nontender. No distention.  Musculoskeletal: No lower extremity tenderness nor edema. No gross deformities of extremities. Neurologic:  Normal speech and language. No gross focal neurologic deficits are appreciated.  Skin:  Skin is warm, dry and intact. No rash noted. Psychiatric: Mood and affect are normal. Speech and behavior are normal.  ____________________________________________   LABS (all labs ordered are listed, but only abnormal results are displayed)  Labs Reviewed  BASIC METABOLIC PANEL - Abnormal; Notable for the following:       Result Value   Glucose, Bld 107 (*)    Calcium 8.8 (*)    All other components within normal limits  CBC  TROPONIN I   ____________________________________________  EKG  ED ECG REPORT I, Perry Molla, the attending physician, personally viewed and interpreted this ECG.  Date: 07/18/2016 EKG Time: 13:49 Rate: 29 Rhythm: normal sinus rhythm QRS Axis: normal Intervals: normal ST/T Wave abnormalities: normal Conduction Disturbances: none Narrative Interpretation: unremarkable  ____________________________________________  RADIOLOGY   Dg Chest 2 View  Result Date: 07/18/2016 CLINICAL DATA:  Shortness of breath. Left-sided mandibular region pain EXAM: CHEST  2 VIEW COMPARISON:  August 30, 2012 FINDINGS: There is no edema or consolidation. Heart size and pulmonary vascularity are normal. No adenopathy. No bone lesions. No pneumothorax. IMPRESSION: No edema or consolidation. Electronically Signed   By: Lowella Grip III M.D.   On: 07/18/2016 14:58     ____________________________________________   PROCEDURES  Procedure(s) performed:   Procedures   Critical Care performed: No ____________________________________________   INITIAL IMPRESSION / ASSESSMENT AND PLAN / ED COURSE  Pertinent labs & imaging results that were available during my care of the patient were reviewed by me and considered in my medical decision making (see chart for details).  HEART score 2, Wells score for PE is zero.  Suspect a benign etiology such as SVT although the patient does not report any recent increase in caffeine intake or similar stimulants.  She has a negative troponin and has been symptomatic since last night intermittently so I do not feel there is a benefit to a repeat troponin.  She has low risk for negative outcomes associated with ACS and her history of present illness, signs, symptoms, and evaluation is reassuring that she does not have any other emergent or life-threatening cause of her symptoms.  She is comfortable with the plan to follow up with Dr. Nehemiah Massed.  She takes a daily baby aspirin and I do not feel is necessary to give her a full dose aspirin at this time.  I encouraged her to call either today or tomorrow to schedule the next available appointment  and she plans to do so.  I gave my usual and customary return precautions.    ____________________________________________  FINAL CLINICAL IMPRESSION(S) / ED DIAGNOSES  Final diagnoses:  Chest discomfort  Heart palpitations     MEDICATIONS GIVEN DURING THIS VISIT:  Medications - No data to display   NEW OUTPATIENT MEDICATIONS STARTED DURING THIS VISIT:  New Prescriptions   No medications on file    Modified Medications   No medications on file    Discontinued Medications   No medications on file     Note:  This document was prepared using Dragon voice recognition software and may include unintentional dictation errors.    Hinda Kehr, MD 07/18/16 [phone]

## 2016-07-18 NOTE — ED Notes (Signed)
Pt ambulatory to lobby. NAD noted at this time.

## 2016-07-18 NOTE — Discharge Instructions (Signed)

## 2016-10-13 ENCOUNTER — Ambulatory Visit: Payer: Self-pay | Admitting: Urology

## 2016-11-09 ENCOUNTER — Ambulatory Visit
Admission: EM | Admit: 2016-11-09 | Discharge: 2016-11-09 | Disposition: A | Discharge status: Home / Self Care | Payer: Managed Care, Other (non HMO) | Attending: Family Medicine | Admitting: Family Medicine

## 2016-11-09 DIAGNOSIS — J029 Acute pharyngitis, unspecified: Secondary | ICD-10-CM | POA: Diagnosis not present

## 2016-11-09 DIAGNOSIS — R590 Localized enlarged lymph nodes: Secondary | ICD-10-CM

## 2016-11-09 LAB — RAPID STREP SCREEN (MED CTR MEBANE ONLY): Streptococcus, Group A Screen (Direct): NEGATIVE

## 2016-11-09 MED ORDER — FEXOFENADINE-PSEUDOEPHED ER 180-240 MG PO TB24: 1.0000 | ORAL_TABLET | Freq: Every day | ORAL | 0 refills | Status: DC

## 2016-11-09 NOTE — ED Triage Notes (Signed)
Pt c/o sore throat, sore neck on the right side and very fatigued.

## 2016-11-09 NOTE — ED Provider Notes (Signed)
[facility]    CSN: [id number] Arrival date & time: 11/09/16  0846     History   Chief Complaint Chief Complaint  Patient presents with  . Sore Throat    HPI Margaret Bryan is a 56 y.o. female.   Patient is here because of sore throat started on Tuesday. She states last week husband had the flu and now he's had sinus infection she's worried that she's caught something from him. She's also concerned about swelling underneath her neck and along the right ear as well. She does not smoke. No known drug allergies she's had abdominal hysterectomy nasal septal surgery and she's had a D&C before as well. She has a past history of heart murmur migraine before the past.   The history is provided by the patient. No language interpreter was used.  Sore Throat  This is a new problem. The problem occurs constantly. Pertinent negatives include no chest pain, no abdominal pain, no headaches and no shortness of breath. Nothing aggravates the symptoms. Nothing relieves the symptoms. She has tried nothing for the symptoms. The treatment provided no relief.    Past Medical History:  Diagnosis Date  . Heart murmur   . Migraine     There are no active problems to display for this patient.   Past Surgical History:  Procedure Laterality Date  . ABDOMINAL HYSTERECTOMY    . NASAL SINUS SURGERY      OB History    No data available       Home Medications    Prior to Admission medications   Medication Sig Start Date End Date Taking? Authorizing Provider  fexofenadine-pseudoephedrine (ALLEGRA-D ALLERGY & CONGESTION) 180-240 MG 24 hr tablet Take 1 tablet by mouth daily. 11/09/16   Frederich Cha, MD    Family History Family History  Problem Relation Age of Onset  . Heart failure Mother   . Hypertension Mother   . Heart failure Father   . Hypertension Father   . Breast cancer Maternal Aunt 35  . Breast cancer Maternal Grandmother 69  . Breast cancer Cousin 20    Social  History Social History  Substance Use Topics  . Smoking status: Never Smoker  . Smokeless tobacco: Never Used  . Alcohol use No     Allergies   Imitrex [sumatriptan]; Percocet [oxycodone-acetaminophen]; and Percocet [oxycodone-acetaminophen]   Review of Systems Review of Systems  HENT: Positive for congestion, facial swelling and sneezing.   Respiratory: Negative for shortness of breath.   Cardiovascular: Negative for chest pain.  Gastrointestinal: Negative for abdominal pain.  Neurological: Negative for headaches.  All other systems reviewed and are negative.    Physical Exam Triage Vital Signs ED Triage Vitals  Enc Vitals Group     BP 11/09/16 0904 139/85     Pulse Rate 11/09/16 0904 66     Resp 11/09/16 0904 18     Temp 11/09/16 0904 97.7 F (36.5 C)     Temp Source 11/09/16 0904 Oral     SpO2 11/09/16 0904 100 %     Weight 11/09/16 0903 212 lb (96.2 kg)     Height 11/09/16 0903 5\' 8"  (1.727 m)     Head Circumference --      Peak Flow --      Pain Score 11/09/16 0903 7     Pain Loc --      Pain Edu? --      Excl. in [place]? --    No data found.  Updated Vital Signs BP 139/85 (BP Location: Left Arm)   Pulse 66   Temp 97.7 F (36.5 C) (Oral)   Resp 18   Ht 5\' 8"  (1.727 m)   Wt 212 lb (96.2 kg)   SpO2 100%   BMI 32.23 kg/m   Visual Acuity Right Eye Distance:   Left Eye Distance:   Bilateral Distance:    Right Eye Near:   Left Eye Near:    Bilateral Near:     Physical Exam  Constitutional: She appears well-developed and well-nourished.  HENT:  Head: Normocephalic and atraumatic.  Right Ear: External ear normal.  Left Ear: External ear normal.  Nose: Mucosal edema and rhinorrhea present. Right sinus exhibits no maxillary sinus tenderness and no frontal sinus tenderness. Left sinus exhibits no maxillary sinus tenderness and no frontal sinus tenderness.  Mouth/Throat: Uvula is midline. No uvula swelling. Posterior oropharyngeal erythema present.   Eyes: Pupils are equal, round, and reactive to light.  Neck: Trachea normal and normal range of motion. Neck supple. No thyromegaly present.    Patient has some anterior cervical nodes and some posterior cervical nodes this was the swelling that she had behind her right ear  Pulmonary/Chest: Breath sounds normal.  Musculoskeletal: Normal range of motion.  Neurological: She is alert.  Skin: Skin is warm.  Psychiatric: She has a normal mood and affect.  Vitals reviewed.    UC Treatments / Results  Labs (all labs ordered are listed, but only abnormal results are displayed) Labs Reviewed  RAPID STREP SCREEN (NOT AT [facility])  CULTURE, GROUP A STREP [facility])    EKG  EKG Interpretation None       Radiology No results found.  Procedures Procedures (including critical care time)  Medications Ordered in UC Medications - No data to display   Initial Impression / Assessment and Plan / UC Course  I have reviewed the triage vital signs and the nursing notes.  Pertinent labs & imaging results that were available during my care of the patient were reviewed by me and considered in my medical decision making (see chart for details).     Patient with some anterior adenopathy also some posterior adenopathy behind the right ear but nothing out of the ordinary. This time until this is all viral) gargle with salt water Allegra-D 1 tablet daily follow-up with PCP in a week not better and will notify her strep test is positive and within her antibiotics at that time. Final Clinical Impressions(s) / UC Diagnoses   Final diagnoses:  Acute pharyngitis, unspecified etiology  Lymphadenopathy of right cervical region    New Prescriptions Current Discharge Medication List    START taking these medications   Details  fexofenadine-pseudoephedrine (ALLEGRA-D ALLERGY & CONGESTION) 180-240 MG 24 hr tablet Take 1 tablet by mouth daily. Qty: 30 tablet, Refills: 0         Note: This dictation  was prepared with Dragon dictation along with smaller phrase technology. Any transcriptional errors that result from this process are unintentional.   Frederich Cha, MD 11/09/16 1006

## 2016-11-11 LAB — CULTURE, GROUP A STREP (THRC)

## 2017-03-19 ENCOUNTER — Other Ambulatory Visit: Payer: Self-pay | Admitting: Family Medicine

## 2017-03-19 DIAGNOSIS — Z1231 Encounter for screening mammogram for malignant neoplasm of breast: Secondary | ICD-10-CM

## 2017-04-17 ENCOUNTER — Ambulatory Visit
Admission: RE | Admit: 2017-04-17 | Discharge: 2017-04-17 | Disposition: A | Discharge status: Home / Self Care | Payer: PRIVATE HEALTH INSURANCE | Source: Ambulatory Visit | Attending: Family Medicine | Admitting: Family Medicine

## 2017-04-17 DIAGNOSIS — Z1231 Encounter for screening mammogram for malignant neoplasm of breast: Secondary | ICD-10-CM | POA: Diagnosis not present

## 2017-07-19 ENCOUNTER — Other Ambulatory Visit: Payer: Self-pay | Admitting: Student

## 2017-07-19 DIAGNOSIS — R1310 Dysphagia, unspecified: Secondary | ICD-10-CM

## 2017-07-24 ENCOUNTER — Ambulatory Visit: Payer: Managed Care, Other (non HMO)

## 2017-07-30 ENCOUNTER — Ambulatory Visit
Admission: RE | Admit: 2017-07-30 | Discharge: 2017-07-30 | Disposition: A | Discharge status: Home / Self Care | Payer: PRIVATE HEALTH INSURANCE | Source: Ambulatory Visit | Attending: Student | Admitting: Student

## 2017-07-30 DIAGNOSIS — K224 Dyskinesia of esophagus: Secondary | ICD-10-CM | POA: Insufficient documentation

## 2017-07-30 DIAGNOSIS — R1310 Dysphagia, unspecified: Secondary | ICD-10-CM | POA: Diagnosis not present

## 2017-12-03 ENCOUNTER — Ambulatory Visit (INDEPENDENT_AMBULATORY_CARE_PROVIDER_SITE_OTHER): Payer: PRIVATE HEALTH INSURANCE | Admitting: Certified Nurse Midwife

## 2017-12-03 ENCOUNTER — Encounter: Payer: Self-pay | Admitting: Certified Nurse Midwife

## 2017-12-03 VITALS — BP 117/75 | HR 78 | Ht 68.0 in | Wt 191.1 lb

## 2017-12-03 DIAGNOSIS — Z01419 Encounter for gynecological examination (general) (routine) without abnormal findings: Secondary | ICD-10-CM | POA: Diagnosis not present

## 2017-12-03 DIAGNOSIS — Z124 Encounter for screening for malignant neoplasm of cervix: Secondary | ICD-10-CM | POA: Diagnosis not present

## 2017-12-03 MED ORDER — NITROFURANTOIN MONOHYD MACRO 100 MG PO CAPS: 100.0000 mg | ORAL_CAPSULE | Freq: Every day | ORAL | 5 refills | Status: DC

## 2017-12-03 NOTE — Progress Notes (Signed)
ANNUAL PREVENTATIVE CARE GYN  ENCOUNTER NOTE  Subjective:       Margaret Bryan is a 57 y.o. G3P3 female here for a routine annual gynecologic exam.  Current complaints: 1. Needs Pap smear per insurance.   Denies difficulty breathing or respiratory distress, chest pain, abdominal pain, vaginal bleeding, dysuria, and leg pain or swelling.   Takes Macrobid after sex to prevent UTI, needs refill.    Gynecologic History  No LMP recorded. Patient has had a hysterectomy.  Contraception: status post hysterectomy  Last Pap: 2015. Results were: normal  Last mammogram: 04/2017. Results were: normal  Last colonoscopy:   Obstetric History  OB History  Gravida Para Term Preterm AB Living  3 3          SAB TAB Ectopic Multiple Live Births               # Outcome Date GA Lbr Len/2nd Weight Sex Delivery Anes PTL Lv  3 Para 1995    F Vag-Spont     2 Para 1992    F Vag-Spont     1 Para 1989    F Vag-Spont  N     Past Medical History:  Diagnosis Date  . Heart murmur   . Migraine     Past Surgical History:  Procedure Laterality Date  . ABDOMINAL HYSTERECTOMY    . NASAL SINUS SURGERY      Current Outpatient Medications on File Prior to Visit  Medication Sig Dispense Refill  . eletriptan (RELPAX) 20 MG tablet Take 20 mg by mouth as needed for migraine or headache. May repeat in 2 hours if headache persists or recurs.    . fexofenadine-pseudoephedrine (ALLEGRA-D ALLERGY & CONGESTION) 180-240 MG 24 hr tablet Take 1 tablet by mouth daily. 30 tablet 0  . nitrofurantoin, macrocrystal-monohydrate, (MACROBID) 100 MG capsule Take 100 mg by mouth 2 (two) times daily.    Marland Kitchen omeprazole (PRILOSEC) 40 MG capsule Take 40 mg by mouth daily.    Marland Kitchen topiramate (TOPAMAX) 100 MG tablet Take 100 mg by mouth 2 (two) times daily.     No current facility-administered medications on file prior to visit.     Allergies  Allergen Reactions  . Imitrex [Sumatriptan]   . Percocet [Oxycodone-Acetaminophen] Other  (See Comments)    Hallucinations, rapid heartbeat  . Percocet [Oxycodone-Acetaminophen]     Social History   Socioeconomic History  . Marital status: Married    Spouse name: Not on file  . Number of children: Not on file  . Years of education: Not on file  . Highest education level: Not on file  Occupational History  . Not on file  Social Needs  . Financial resource strain: Not on file  . Food insecurity:    Worry: Not on file    Inability: Not on file  . Transportation needs:    Medical: Not on file    Non-medical: Not on file  Tobacco Use  . Smoking status: Never Smoker  . Smokeless tobacco: Never Used  Substance and Sexual Activity  . Alcohol use: No  . Drug use: Never  . Sexual activity: Yes  Lifestyle  . Physical activity:    Days per week: Not on file    Minutes per session: Not on file  . Stress: Not on file  Relationships  . Social connections:    Talks on phone: Not on file    Gets together: Not on file    Attends religious service: Not on  file    Active member of club or organization: Not on file    Attends meetings of clubs or organizations: Not on file    Relationship status: Not on file  . Intimate partner violence:    Fear of current or ex partner: Not on file    Emotionally abused: Not on file    Physically abused: Not on file    Forced sexual activity: Not on file  Other Topics Concern  . Not on file  Social History Narrative  . Not on file    Family History  Problem Relation Age of Onset  . Heart failure Mother   . Hypertension Mother   . Heart failure Father   . Hypertension Father   . Breast cancer Maternal Aunt 35  . Breast cancer Maternal Grandmother 53  . Breast cancer Cousin 86    The following portions of the patient's history were reviewed and updated as appropriate: allergies, current medications, past family history, past medical history, past social history, past surgical history and problem list.  Review of Systems  ROS  negative except as noted above. Information obtained from patient.    Objective:   BP 117/75   Pulse 78   Ht 5\' 8"  (1.727 m)   Wt 191 lb 1.6 oz (86.7 kg)   BMI 29.06 kg/m    CONSTITUTIONAL: Well-developed, well-nourished female in no acute distress.   PSYCHIATRIC: Normal mood and affect. Normal behavior. Normal judgment and thought content.  [place]: Alert and oriented to person, place, and time. Normal muscle tone coordination. No cranial nerve deficit noted.  HENT:  Normocephalic, atraumatic, External right and left ear normal.   EYES: Conjunctivae and EOM are normal. Pupils are equal and  round. No scleral icterus.   NECK: Normal range of motion, supple, no masses.  Normal thyroid.   SKIN: Skin is warm and dry. No rash noted. Not diaphoretic. No erythema. No pallor.  CARDIOVASCULAR: Normal heart rate noted, regular rhythm, no murmur.  RESPIRATORY: Clear to auscultation bilaterally. Effort and breath sounds normal, no problems with respiration noted.  BREASTS: Symmetric in size. No masses, skin changes, nipple drainage, or lymphadenopathy.  ABDOMEN: Soft, normal bowel sounds, no distention noted.  No tenderness, rebound or guarding.   PELVIC:  External Genitalia: Normal  Vagina: Normal  Cervix: Normal  Uterus: Surgically absent  Adnexa: Surgically absent   MUSCULOSKELETAL: Normal range of motion. No tenderness.  No cyanosis, clubbing, or edema.  2+ distal pulses.  LYMPHATIC: No Axillary, Supraclavicular, or Inguinal Adenopathy.  Assessment:   Annual gynecologic examination 57 y.o.   Contraception: status post hysterectomy   Normal BMI   Problem List Items Addressed This Visit    None    Visit Diagnoses    Well woman exam with routine gynecological exam    -  Primary   Relevant Orders   PapIG, HPV, rfx 16/18   Screening for cervical cancer       Relevant Orders   PapIG, HPV, rfx 16/18      Plan:   Pap: Pap Co Test.  Mammogram: Not  Indicated.  Labs: By PCP.  Rx: Macrobid, see orders.   Routine preventative health maintenance measures emphasized: Exercise/Diet/Weight control, Alcohol/Substance use risks and Stress Management.  Reviewed red flag symptoms and when to call.  RTC x 1-5 years for Annual Exam or sooner if needed.    Diona Fanti, CNM [facility], [facility]

## 2017-12-03 NOTE — Patient Instructions (Addendum)
Nitrofurantoin tablets or capsules What is this medicine? NITROFURANTOIN (nye troe fyoor AN toyn) is an antibiotic. It is used to treat urinary tract infections. This medicine may be used for other purposes; ask your health care provider or pharmacist if you have questions. COMMON BRAND NAME(S): Macrobid, Macrodantin, Urotoin What should I tell my health care provider before I take this medicine? They need to know if you have any of these conditions: -anemia -diabetes -glucose-6-phosphate dehydrogenase deficiency -kidney disease -liver disease -lung disease -other chronic illness -an unusual or allergic reaction to nitrofurantoin, other antibiotics, other medicines, foods, dyes or preservatives -pregnant or trying to get pregnant -breast-feeding How should I use this medicine? Take this medicine by mouth with a glass of water. Follow the directions on the prescription label. Take this medicine with food or milk. Take your doses at regular intervals. Do not take your medicine more often than directed. Do not stop taking except on your doctor's advice. Talk to your pediatrician regarding the use of this medicine in children. While this drug may be prescribed for selected conditions, precautions do apply. Overdosage: If you think you have taken too much of this medicine contact a poison control center or emergency room at once. NOTE: This medicine is only for you. Do not share this medicine with others. What if I miss a dose? If you miss a dose, take it as soon as you can. If it is almost time for your next dose, take only that dose. Do not take double or extra doses. What may interact with this medicine? -antacids containing magnesium trisilicate -probenecid -quinolone antibiotics like ciprofloxacin, lomefloxacin, norfloxacin and ofloxacin -sulfinpyrazone This list may not describe all possible interactions. Give your health care provider a list of all the medicines, herbs,  non-prescription drugs, or dietary supplements you use. Also tell them if you smoke, drink alcohol, or use illegal drugs. Some items may interact with your medicine. What should I watch for while using this medicine? Tell your doctor or health care professional if your symptoms do not improve or if you get new symptoms. Drink several glasses of water a day. If you are taking this medicine for a long time, visit your doctor for regular checks on your progress. If you are diabetic, you may get a false positive result for sugar in your urine with certain brands of urine tests. Check with your doctor. What side effects may I notice from receiving this medicine? Side effects that you should report to your doctor or health care professional as soon as possible: -allergic reactions like skin rash or hives, swelling of the face, lips, or tongue -chest pain -cough -difficulty breathing -dizziness, drowsiness -fever or infection -joint aches or pains -pale or blue-tinted skin -redness, blistering, peeling or loosening of the skin, including inside the mouth -tingling, burning, pain, or numbness in hands or feet -unusual bleeding or bruising -unusually weak or tired -yellowing of eyes or skin Side effects that usually do not require medical attention (report to your doctor or health care professional if they continue or are bothersome): -dark urine -diarrhea -headache -loss of appetite -nausea or vomiting -temporary hair loss This list may not describe all possible side effects. Call your doctor for medical advice about side effects. You may report side effects to FDA at 1-800-FDA-1088. Where should I keep my medicine? Keep out of the reach of children. Store at room temperature between 15 and 30 degrees C (59 and 86 degrees F). Protect from light. Throw away any unused  medicine after the expiration date. NOTE: This sheet is a summary. It may not cover all possible information. If you have  questions about this medicine, talk to your doctor, pharmacist, or health care provider.  2018 Elsevier/Gold Standard (2008-03-18 15:56:47) Preventive Care 40-64 Years, Female Preventive care refers to lifestyle choices and visits with your health care provider that can promote health and wellness. What does preventive care include?  A yearly physical exam. This is also called an annual well check.  Dental exams once or twice a year.  Routine eye exams. Ask your health care provider how often you should have your eyes checked.  Personal lifestyle choices, including: ? Daily care of your teeth and gums. ? Regular physical activity. ? Eating a healthy diet. ? Avoiding tobacco and drug use. ? Limiting alcohol use. ? Practicing safe sex. ? Taking low-dose aspirin daily starting at age 31. ? Taking vitamin and mineral supplements as recommended by your health care provider. What happens during an annual well check? The services and screenings done by your health care provider during your annual well check will depend on your age, overall health, lifestyle risk factors, and family history of disease. Counseling Your health care provider may ask you questions about your:  Alcohol use.  Tobacco use.  Drug use.  Emotional well-being.  Home and relationship well-being.  Sexual activity.  Eating habits.  Work and work Statistician.  Method of birth control.  Menstrual cycle.  Pregnancy history.  Screening You may have the following tests or measurements:  Height, weight, and BMI.  Blood pressure.  Lipid and cholesterol levels. These may be checked every 5 years, or more frequently if you are over 64 years old.  Skin check.  Lung cancer screening. You may have this screening every year starting at age 34 if you have a 30-pack-year history of smoking and currently smoke or have quit within the past 15 years.  Fecal occult blood test (FOBT) of the stool. You may have  this test every year starting at age 43.  Flexible sigmoidoscopy or colonoscopy. You may have a sigmoidoscopy every 5 years or a colonoscopy every 10 years starting at age 28.  Hepatitis C blood test.  Hepatitis B blood test.  Sexually transmitted disease (STD) testing.  Diabetes screening. This is done by checking your blood sugar (glucose) after you have not eaten for a while (fasting). You may have this done every 1-3 years.  Mammogram. This may be done every 1-2 years. Talk to your health care provider about when you should start having regular mammograms. This may depend on whether you have a family history of breast cancer.  BRCA-related cancer screening. This may be done if you have a family history of breast, ovarian, tubal, or peritoneal cancers.  Pelvic exam and Pap test. This may be done every 3 years starting at age 3. Starting at age 23, this may be done every 5 years if you have a Pap test in combination with an HPV test.  Bone density scan. This is done to screen for osteoporosis. You may have this scan if you are at high risk for osteoporosis.  Discuss your test results, treatment options, and if necessary, the need for more tests with your health care provider. Vaccines Your health care provider may recommend certain vaccines, such as:  Influenza vaccine. This is recommended every year.  Tetanus, diphtheria, and acellular pertussis (Tdap, Td) vaccine. You may need a Td booster every 10 years.  Varicella vaccine.  You may need this if you have not been vaccinated.  Zoster vaccine. You may need this after age 75.  Measles, mumps, and rubella (MMR) vaccine. You may need at least one dose of MMR if you were born in 1957 or later. You may also need a second dose.  Pneumococcal 13-valent conjugate (PCV13) vaccine. You may need this if you have certain conditions and were not previously vaccinated.  Pneumococcal polysaccharide (PPSV23) vaccine. You may need one or two  doses if you smoke cigarettes or if you have certain conditions.  Meningococcal vaccine. You may need this if you have certain conditions.  Hepatitis A vaccine. You may need this if you have certain conditions or if you travel or work in places where you may be exposed to hepatitis A.  Hepatitis B vaccine. You may need this if you have certain conditions or if you travel or work in places where you may be exposed to hepatitis B.  Haemophilus influenzae type b (Hib) vaccine. You may need this if you have certain conditions.  Talk to your health care provider about which screenings and vaccines you need and how often you need them. This information is not intended to replace advice given to you by your health care provider. Make sure you discuss any questions you have with your health care provider. Document Released: 09/24/2015 Document Revised: 05/17/2016 Document Reviewed: 06/29/2015 Elsevier Interactive Patient Education  [employer].

## 2017-12-06 ENCOUNTER — Encounter: Payer: Self-pay | Admitting: Certified Nurse Midwife

## 2017-12-06 LAB — PAPIG, HPV, RFX 16/18
HPV, high-risk: NEGATIVE
PAP SMEAR COMMENT: 0

## 2017-12-07 ENCOUNTER — Encounter: Payer: PRIVATE HEALTH INSURANCE | Admitting: Certified Nurse Midwife

## 2018-01-21 ENCOUNTER — Encounter: Payer: Self-pay | Admitting: Emergency Medicine

## 2018-01-21 ENCOUNTER — Other Ambulatory Visit: Payer: Self-pay

## 2018-01-21 ENCOUNTER — Ambulatory Visit
Admission: EM | Admit: 2018-01-21 | Discharge: 2018-01-21 | Disposition: A | Discharge status: Home / Self Care | Payer: No Typology Code available for payment source | Attending: Family Medicine | Admitting: Family Medicine

## 2018-01-21 DIAGNOSIS — B9789 Other viral agents as the cause of diseases classified elsewhere: Secondary | ICD-10-CM | POA: Diagnosis not present

## 2018-01-21 DIAGNOSIS — J069 Acute upper respiratory infection, unspecified: Secondary | ICD-10-CM

## 2018-01-21 DIAGNOSIS — R05 Cough: Secondary | ICD-10-CM

## 2018-01-21 MED ORDER — HYDROCOD POLST-CPM POLST ER 10-8 MG/5ML PO SUER: 5.0000 mL | Freq: Every evening | ORAL | 0 refills | Status: DC | PRN

## 2018-01-21 NOTE — ED Triage Notes (Signed)
Patient c/o sinus pressure, nasal congestion, ear pressure and cough that started on Saturday.

## 2018-01-21 NOTE — ED Provider Notes (Signed)
[facility]    CSN: [id number] Arrival date & time: 01/21/18  6144     History   Chief Complaint Chief Complaint  Patient presents with  . Cough  . Nasal Congestion  . Sinus Problem    HPI Margaret Bryan is a 57 y.o. female.   The history is provided by the patient.  Cough  Associated symptoms: rhinorrhea   Associated symptoms: no headaches and no wheezing   Sinus Problem  Pertinent negatives include no headaches.  URI  Presenting symptoms: congestion, cough and rhinorrhea   Severity:  Moderate Onset quality:  Sudden Duration:  1 day Timing:  Constant Progression:  Worsening Chronicity:  New Relieved by:  None tried Ineffective treatments:  None tried Associated symptoms: no headaches, no sinus pain and no wheezing   Risk factors: sick contacts   Risk factors: not elderly, no chronic cardiac disease, no chronic kidney disease, no chronic respiratory disease, no diabetes mellitus, no immunosuppression, no recent illness and no recent travel     Past Medical History:  Diagnosis Date  . Heart murmur   . Migraine     There are no active problems to display for this patient.   Past Surgical History:  Procedure Laterality Date  . ABDOMINAL HYSTERECTOMY    . NASAL SINUS SURGERY      OB History    Gravida  3   Para  3   Term      Preterm      AB      Living        SAB      TAB      Ectopic      Multiple      Live Births               Home Medications    Prior to Admission medications   Medication Sig Start Date End Date Taking? Authorizing Provider  eletriptan (RELPAX) 20 MG tablet Take 20 mg by mouth as needed for migraine or headache. May repeat in 2 hours if headache persists or recurs.   Yes [provider]  fexofenadine-pseudoephedrine (ALLEGRA-D ALLERGY & CONGESTION) 180-240 MG 24 hr tablet Take 1 tablet by mouth daily. 11/09/16  Yes Frederich Cha, MD  omeprazole (PRILOSEC) 40 MG capsule Take 40 mg by mouth  daily.   Yes [provider]  topiramate (TOPAMAX) 100 MG tablet Take 100 mg by mouth 2 (two) times daily.   Yes [provider]  chlorpheniramine-HYDROcodone (TUSSIONEX PENNKINETIC ER) 10-8 MG/5ML SUER Take 5 mLs by mouth at bedtime as needed. 01/21/18   Norval Gable, MD  nitrofurantoin, macrocrystal-monohydrate, (MACROBID) 100 MG capsule Take 100 mg by mouth 2 (two) times daily.    [provider]  nitrofurantoin, macrocrystal-monohydrate, (MACROBID) 100 MG capsule Take 1 capsule (100 mg total) by mouth at bedtime. 12/03/17   Lawhorn, Lara Mulch, CNM    Family History Family History  Problem Relation Age of Onset  . Heart failure Mother   . Hypertension Mother   . Heart failure Father   . Hypertension Father   . Breast cancer Maternal Aunt 35  . Breast cancer Maternal Grandmother 59  . Breast cancer Cousin 20    Social History Social History   Tobacco Use  . Smoking status: Never Smoker  . Smokeless tobacco: Never Used  Substance Use Topics  . Alcohol use: No  . Drug use: Never     Allergies   Imitrex [sumatriptan]; Percocet [oxycodone-acetaminophen]; and  Percocet [oxycodone-acetaminophen]   Review of Systems Review of Systems  HENT: Positive for congestion and rhinorrhea. Negative for sinus pain.   Respiratory: Positive for cough. Negative for wheezing.   Neurological: Negative for headaches.     Physical Exam Triage Vital Signs ED Triage Vitals  Enc Vitals Group     BP 01/21/18 0929 136/80     Pulse Rate 01/21/18 0929 85     Resp 01/21/18 0929 14     Temp 01/21/18 0929 98.3 F (36.8 C)     Temp Source 01/21/18 0929 Oral     SpO2 01/21/18 0929 98 %     Weight 01/21/18 0926 185 lb (83.9 kg)     Height 01/21/18 0926 5\' 8"  (1.727 m)     Head Circumference --      Peak Flow --      Pain Score 01/21/18 0926 7     Pain Loc --      Pain Edu? --      Excl. in [place]? --    No data found.  Updated Vital Signs BP 136/80 (BP  Location: Left Arm)   Pulse 85   Temp 98.3 F (36.8 C) (Oral)   Resp 14   Ht 5\' 8"  (1.727 m)   Wt 185 lb (83.9 kg)   SpO2 98%   BMI 28.13 kg/m   Visual Acuity Right Eye Distance:   Left Eye Distance:   Bilateral Distance:    Right Eye Near:   Left Eye Near:    Bilateral Near:     Physical Exam  Constitutional: She appears well-developed and well-nourished. No distress.  HENT:  Head: Normocephalic and atraumatic.  Right Ear: Tympanic membrane, external ear and ear canal normal.  Left Ear: Tympanic membrane, external ear and ear canal normal.  Nose: Rhinorrhea present. No mucosal edema, nose lacerations, sinus tenderness, nasal deformity, septal deviation or nasal septal hematoma. No epistaxis.  No foreign bodies. Right sinus exhibits no maxillary sinus tenderness and no frontal sinus tenderness. Left sinus exhibits no maxillary sinus tenderness and no frontal sinus tenderness.  Mouth/Throat: Uvula is midline, oropharynx is clear and moist and mucous membranes are normal. No oropharyngeal exudate.  Eyes: Conjunctivae are normal. Right eye exhibits no discharge. Left eye exhibits no discharge. No scleral icterus.  Neck: Normal range of motion. Neck supple. No thyromegaly present.  Cardiovascular: Normal rate, regular rhythm and normal heart sounds.  Pulmonary/Chest: Effort normal and breath sounds normal. No stridor. No respiratory distress. She has no wheezes. She has no rales.  Lymphadenopathy:    She has no cervical adenopathy.  Skin: She is not diaphoretic.  Nursing note and vitals reviewed.    UC Treatments / Results  Labs (all labs ordered are listed, but only abnormal results are displayed) Labs Reviewed - No data to display  EKG None  Radiology No results found.  Procedures Procedures (including critical care time)  Medications Ordered in UC Medications - No data to display  Initial Impression / Assessment and Plan / UC Course  I have reviewed the triage  vital signs and the nursing notes.  Pertinent labs & imaging results that were available during my care of the patient were reviewed by me and considered in my medical decision making (see chart for details).      Final Clinical Impressions(s) / UC Diagnoses   Final diagnoses:  Viral URI with cough   Discharge Instructions   None    ED Prescriptions    Medication  Sig Dispense Auth. Provider   chlorpheniramine-HYDROcodone (TUSSIONEX PENNKINETIC ER) 10-8 MG/5ML SUER Take 5 mLs by mouth at bedtime as needed. 60 mL Norval Gable, MD     1. diagnosis reviewed with patient 2. rx as per orders above; reviewed possible side effects, interactions, risks and benefits  3. Recommend supportive treatment with rest, fluids, otc analgesics prn 4. Follow-up prn if symptoms worsen or don't improve   Controlled Substance Prescriptions [place] Controlled Substance Registry consulted? Not Applicable   Norval Gable, MD 01/21/18 [id number]

## 2018-02-18 ENCOUNTER — Encounter: Payer: PRIVATE HEALTH INSURANCE | Admitting: Certified Nurse Midwife

## 2018-02-22 ENCOUNTER — Encounter: Payer: PRIVATE HEALTH INSURANCE | Admitting: Certified Nurse Midwife

## 2018-03-22 ENCOUNTER — Ambulatory Visit (INDEPENDENT_AMBULATORY_CARE_PROVIDER_SITE_OTHER): Payer: PRIVATE HEALTH INSURANCE | Admitting: Certified Nurse Midwife

## 2018-03-22 ENCOUNTER — Other Ambulatory Visit (HOSPITAL_COMMUNITY)
Admission: RE | Admit: 2018-03-22 | Discharge: 2018-03-22 | Disposition: A | Discharge status: Home / Self Care | Payer: PRIVATE HEALTH INSURANCE | Source: Ambulatory Visit | Attending: Obstetrics and Gynecology | Admitting: Obstetrics and Gynecology

## 2018-03-22 VITALS — BP 129/85 | HR 65 | Ht 68.0 in | Wt 185.2 lb

## 2018-03-22 DIAGNOSIS — Z90710 Acquired absence of both cervix and uterus: Secondary | ICD-10-CM | POA: Insufficient documentation

## 2018-03-22 DIAGNOSIS — Z124 Encounter for screening for malignant neoplasm of cervix: Secondary | ICD-10-CM | POA: Diagnosis present

## 2018-03-22 DIAGNOSIS — R87615 Unsatisfactory cytologic smear of cervix: Secondary | ICD-10-CM | POA: Insufficient documentation

## 2018-03-22 MED ORDER — NITROFURANTOIN MONOHYD MACRO 100 MG PO CAPS: 100.0000 mg | ORAL_CAPSULE | Freq: Every day | ORAL | 5 refills | Status: DC

## 2018-03-22 NOTE — Progress Notes (Signed)
GYN ENCOUNTER NOTE  Subjective:       Margaret Bryan is a 57 y.o. G3P3 female here for repeat pap collection due to insufficient cellularity.   Denies difficulty breathing or respiratory distress, chest pain, abdominal pain, vaginal bleeding, dysuria, and leg pain or swelling.     Gynecologic History  No LMP recorded. Patient has had a hysterectomy.  Contraception: status post hysterectomy  Last Pap: 2015. Results were: normal  Last mammogram: 04/2017. Results were: normal  Obstetric History  OB History  Gravida Para Term Preterm AB Living  3 3          SAB TAB Ectopic Multiple Live Births               # Outcome Date GA Lbr Len/2nd Weight Sex Delivery Anes PTL Lv  3 Para 1995    F Vag-Spont     2 Para 1992    F Vag-Spont     1 Para 1989    F Vag-Spont  N     Past Medical History:  Diagnosis Date  . Heart murmur   . Migraine     Past Surgical History:  Procedure Laterality Date  . ABDOMINAL HYSTERECTOMY    . NASAL SINUS SURGERY      Current Outpatient Medications on File Prior to Visit  Medication Sig Dispense Refill  . eletriptan (RELPAX) 20 MG tablet Take 20 mg by mouth as needed for migraine or headache. May repeat in 2 hours if headache persists or recurs.    . fexofenadine-pseudoephedrine (ALLEGRA-D ALLERGY & CONGESTION) 180-240 MG 24 hr tablet Take 1 tablet by mouth daily. 30 tablet 0  . Magnesium 250 MG TABS Take by mouth.    . meclizine (ANTIVERT) 12.5 MG tablet Take by mouth.    . Multiple Vitamins-Minerals (ONE-A-DAY 50 PLUS PO) Take by mouth.    Marland Kitchen omeprazole (PRILOSEC) 40 MG capsule Take 40 mg by mouth daily.    Marland Kitchen topiramate (TOPAMAX) 100 MG tablet Take 100 mg by mouth 2 (two) times daily.     No current facility-administered medications on file prior to visit.     Allergies  Allergen Reactions  . Imitrex [Sumatriptan]   . Percocet [Oxycodone-Acetaminophen] Other (See Comments)    Hallucinations, rapid heartbeat  . Percocet  [Oxycodone-Acetaminophen]     Social History   Socioeconomic History  . Marital status: Married    Spouse name: Not on file  . Number of children: Not on file  . Years of education: Not on file  . Highest education level: Not on file  Occupational History  . Not on file  Social Needs  . Financial resource strain: Not on file  . Food insecurity:    Worry: Not on file    Inability: Not on file  . Transportation needs:    Medical: Not on file    Non-medical: Not on file  Tobacco Use  . Smoking status: Never Smoker  . Smokeless tobacco: Never Used  Substance and Sexual Activity  . Alcohol use: No  . Drug use: Never  . Sexual activity: Yes  Lifestyle  . Physical activity:    Days per week: Not on file    Minutes per session: Not on file  . Stress: Not on file  Relationships  . Social connections:    Talks on phone: Not on file    Gets together: Not on file    Attends religious service: Not on file    Active member of club  or organization: Not on file    Attends meetings of clubs or organizations: Not on file    Relationship status: Not on file  . Intimate partner violence:    Fear of current or ex partner: Not on file    Emotionally abused: Not on file    Physically abused: Not on file    Forced sexual activity: Not on file  Other Topics Concern  . Not on file  Social History Narrative  . Not on file    Family History  Problem Relation Age of Onset  . Heart failure Mother   . Hypertension Mother   . Heart failure Father   . Hypertension Father   . Breast cancer Maternal Aunt 35  . Breast cancer Maternal Grandmother 40  . Breast cancer Cousin 75    The following portions of the patient's history were reviewed and updated as appropriate: allergies, current medications, past family history, past medical history, past social history, past surgical history and problem list.  Review of Systems  ROS negative except as noted above. Information obtained from  patient.   Objective:   BP 129/85   Pulse 65   Ht 5\' 8"  (1.727 m)   Wt 185 lb 4 oz (84 kg)   BMI 28.17 kg/m  CONSTITUTIONAL: Well-developed, well-nourished female in no acute distress.   PELVIC: Pap recollected  Assessment:   1. Encounter for repeat Pap smear due to previous insuff cervical cells  - Cytology - PAP  2. Screening for cervical cancer  - Cytology - PAP   Plan:   Labs: Pap and HPV, see orders  Reviewed red flag symptoms and when to call  RTC as needed   Diona Fanti, CNM [facility], [facility]

## 2018-03-22 NOTE — Addendum Note (Signed)
Addended by: Raliegh Ip on: 03/22/2018 10:46 AM   Modules accepted: Orders

## 2018-03-22 NOTE — Progress Notes (Signed)
Pt is here for a repeat pap smear due to insufficient cells. Has had a hysterectomy.

## 2018-03-22 NOTE — Patient Instructions (Signed)
Nitrofurantoin tablets or capsules What is this medicine? NITROFURANTOIN (nye troe fyoor AN toyn) is an antibiotic. It is used to treat urinary tract infections. This medicine may be used for other purposes; ask your health care provider or pharmacist if you have questions. COMMON BRAND NAME(S): Macrobid, Macrodantin, Urotoin What should I tell my health care provider before I take this medicine? They need to know if you have any of these conditions: -anemia -diabetes -glucose-6-phosphate dehydrogenase deficiency -kidney disease -liver disease -lung disease -other chronic illness -an unusual or allergic reaction to nitrofurantoin, other antibiotics, other medicines, foods, dyes or preservatives -pregnant or trying to get pregnant -breast-feeding How should I use this medicine? Take this medicine by mouth with a glass of water. Follow the directions on the prescription label. Take this medicine with food or milk. Take your doses at regular intervals. Do not take your medicine more often than directed. Do not stop taking except on your doctor's advice. Talk to your pediatrician regarding the use of this medicine in children. While this drug may be prescribed for selected conditions, precautions do apply. Overdosage: If you think you have taken too much of this medicine contact a poison control center or emergency room at once. NOTE: This medicine is only for you. Do not share this medicine with others. What if I miss a dose? If you miss a dose, take it as soon as you can. If it is almost time for your next dose, take only that dose. Do not take double or extra doses. What may interact with this medicine? -antacids containing magnesium trisilicate -probenecid -quinolone antibiotics like ciprofloxacin, lomefloxacin, norfloxacin and ofloxacin -sulfinpyrazone This list may not describe all possible interactions. Give your health care provider a list of all the medicines, herbs,  non-prescription drugs, or dietary supplements you use. Also tell them if you smoke, drink alcohol, or use illegal drugs. Some items may interact with your medicine. What should I watch for while using this medicine? Tell your doctor or health care professional if your symptoms do not improve or if you get new symptoms. Drink several glasses of water a day. If you are taking this medicine for a long time, visit your doctor for regular checks on your progress. If you are diabetic, you may get a false positive result for sugar in your urine with certain brands of urine tests. Check with your doctor. What side effects may I notice from receiving this medicine? Side effects that you should report to your doctor or health care professional as soon as possible: -allergic reactions like skin rash or hives, swelling of the face, lips, or tongue -chest pain -cough -difficulty breathing -dizziness, drowsiness -fever or infection -joint aches or pains -pale or blue-tinted skin -redness, blistering, peeling or loosening of the skin, including inside the mouth -tingling, burning, pain, or numbness in hands or feet -unusual bleeding or bruising -unusually weak or tired -yellowing of eyes or skin Side effects that usually do not require medical attention (report to your doctor or health care professional if they continue or are bothersome): -dark urine -diarrhea -headache -loss of appetite -nausea or vomiting -temporary hair loss This list may not describe all possible side effects. Call your doctor for medical advice about side effects. You may report side effects to FDA at 1-800-FDA-1088. Where should I keep my medicine? Keep out of the reach of children. Store at room temperature between 15 and 30 degrees C (59 and 86 degrees F). Protect from light. Throw away any unused  medicine after the expiration date. NOTE: This sheet is a summary. It may not cover all possible information. If you have  questions about this medicine, talk to your doctor, pharmacist, or health care provider.  2018 Elsevier/Gold Standard (2008-03-18 15:56:47) Preventive Care 40-64 Years, Female Preventive care refers to lifestyle choices and visits with your health care provider that can promote health and wellness. What does preventive care include?  A yearly physical exam. This is also called an annual well check.  Dental exams once or twice a year.  Routine eye exams. Ask your health care provider how often you should have your eyes checked.  Personal lifestyle choices, including: ? Daily care of your teeth and gums. ? Regular physical activity. ? Eating a healthy diet. ? Avoiding tobacco and drug use. ? Limiting alcohol use. ? Practicing safe sex. ? Taking low-dose aspirin daily starting at age 31. ? Taking vitamin and mineral supplements as recommended by your health care provider. What happens during an annual well check? The services and screenings done by your health care provider during your annual well check will depend on your age, overall health, lifestyle risk factors, and family history of disease. Counseling Your health care provider may ask you questions about your:  Alcohol use.  Tobacco use.  Drug use.  Emotional well-being.  Home and relationship well-being.  Sexual activity.  Eating habits.  Work and work Statistician.  Method of birth control.  Menstrual cycle.  Pregnancy history.  Screening You may have the following tests or measurements:  Height, weight, and BMI.  Blood pressure.  Lipid and cholesterol levels. These may be checked every 5 years, or more frequently if you are over 64 years old.  Skin check.  Lung cancer screening. You may have this screening every year starting at age 34 if you have a 30-pack-year history of smoking and currently smoke or have quit within the past 15 years.  Fecal occult blood test (FOBT) of the stool. You may have  this test every year starting at age 43.  Flexible sigmoidoscopy or colonoscopy. You may have a sigmoidoscopy every 5 years or a colonoscopy every 10 years starting at age 28.  Hepatitis C blood test.  Hepatitis B blood test.  Sexually transmitted disease (STD) testing.  Diabetes screening. This is done by checking your blood sugar (glucose) after you have not eaten for a while (fasting). You may have this done every 1-3 years.  Mammogram. This may be done every 1-2 years. Talk to your health care provider about when you should start having regular mammograms. This may depend on whether you have a family history of breast cancer.  BRCA-related cancer screening. This may be done if you have a family history of breast, ovarian, tubal, or peritoneal cancers.  Pelvic exam and Pap test. This may be done every 3 years starting at age 3. Starting at age 23, this may be done every 5 years if you have a Pap test in combination with an HPV test.  Bone density scan. This is done to screen for osteoporosis. You may have this scan if you are at high risk for osteoporosis.  Discuss your test results, treatment options, and if necessary, the need for more tests with your health care provider. Vaccines Your health care provider may recommend certain vaccines, such as:  Influenza vaccine. This is recommended every year.  Tetanus, diphtheria, and acellular pertussis (Tdap, Td) vaccine. You may need a Td booster every 10 years.  Varicella vaccine.  You may need this if you have not been vaccinated.  Zoster vaccine. You may need this after age 75.  Measles, mumps, and rubella (MMR) vaccine. You may need at least one dose of MMR if you were born in 1957 or later. You may also need a second dose.  Pneumococcal 13-valent conjugate (PCV13) vaccine. You may need this if you have certain conditions and were not previously vaccinated.  Pneumococcal polysaccharide (PPSV23) vaccine. You may need one or two  doses if you smoke cigarettes or if you have certain conditions.  Meningococcal vaccine. You may need this if you have certain conditions.  Hepatitis A vaccine. You may need this if you have certain conditions or if you travel or work in places where you may be exposed to hepatitis A.  Hepatitis B vaccine. You may need this if you have certain conditions or if you travel or work in places where you may be exposed to hepatitis B.  Haemophilus influenzae type b (Hib) vaccine. You may need this if you have certain conditions.  Talk to your health care provider about which screenings and vaccines you need and how often you need them. This information is not intended to replace advice given to you by your health care provider. Make sure you discuss any questions you have with your health care provider. Document Released: 09/24/2015 Document Revised: 05/17/2016 Document Reviewed: 06/29/2015 Elsevier Interactive Patient Education  [employer].

## 2018-03-25 LAB — CYTOLOGY - PAP: DIAGNOSIS: NEGATIVE

## 2018-04-18 ENCOUNTER — Other Ambulatory Visit: Payer: Self-pay | Admitting: Family Medicine

## 2018-04-18 DIAGNOSIS — Z1231 Encounter for screening mammogram for malignant neoplasm of breast: Secondary | ICD-10-CM

## 2018-06-04 ENCOUNTER — Ambulatory Visit
Admission: RE | Admit: 2018-06-04 | Discharge: 2018-06-04 | Disposition: A | Discharge status: Home / Self Care | Payer: PRIVATE HEALTH INSURANCE | Source: Ambulatory Visit | Attending: Family Medicine | Admitting: Family Medicine

## 2018-06-04 DIAGNOSIS — Z1231 Encounter for screening mammogram for malignant neoplasm of breast: Secondary | ICD-10-CM | POA: Diagnosis not present

## 2018-07-01 ENCOUNTER — Encounter: Payer: Self-pay | Admitting: Emergency Medicine

## 2018-07-01 ENCOUNTER — Other Ambulatory Visit: Payer: Self-pay

## 2018-07-01 ENCOUNTER — Ambulatory Visit
Admission: EM | Admit: 2018-07-01 | Discharge: 2018-07-01 | Disposition: A | Discharge status: Home / Self Care | Payer: PRIVATE HEALTH INSURANCE | Attending: Emergency Medicine | Admitting: Emergency Medicine

## 2018-07-01 DIAGNOSIS — R1013 Epigastric pain: Secondary | ICD-10-CM

## 2018-07-01 HISTORY — DX: Gastro-esophageal reflux disease without esophagitis: K21.9

## 2018-07-01 LAB — CBC WITH DIFFERENTIAL/PLATELET
Abs Immature Granulocytes: 0.01 10*3/uL (ref 0.00–0.07)
BASOS ABS: 0 10*3/uL (ref 0.0–0.1)
Basophils Relative: 1 %
EOS ABS: 0.1 10*3/uL (ref 0.0–0.5)
EOS PCT: 2 %
HCT: 44.2 % (ref 36.0–46.0)
Hemoglobin: 14.1 g/dL (ref 12.0–15.0)
Immature Granulocytes: 0 %
Lymphocytes Relative: 41 %
Lymphs Abs: 2.1 10*3/uL (ref 0.7–4.0)
MCH: 29.7 pg (ref 26.0–34.0)
MCHC: 31.9 g/dL (ref 30.0–36.0)
MCV: 93.1 fL (ref 80.0–100.0)
Monocytes Absolute: 0.3 10*3/uL (ref 0.1–1.0)
Monocytes Relative: 7 %
NRBC: 0 % (ref 0.0–0.2)
Neutro Abs: 2.5 10*3/uL (ref 1.7–7.7)
Neutrophils Relative %: 49 %
Platelets: 232 10*3/uL (ref 150–400)
RBC: 4.75 MIL/uL (ref 3.87–5.11)
RDW: 13.1 % (ref 11.5–15.5)
WBC: 5.1 10*3/uL (ref 4.0–10.5)

## 2018-07-01 LAB — COMPREHENSIVE METABOLIC PANEL
ALBUMIN: 4.4 g/dL (ref 3.5–5.0)
ALT: 19 U/L (ref 0–44)
AST: 22 U/L (ref 15–41)
Alkaline Phosphatase: 99 U/L (ref 38–126)
Anion gap: 9 (ref 5–15)
BILIRUBIN TOTAL: 0.9 mg/dL (ref 0.3–1.2)
BUN: 11 mg/dL (ref 6–20)
CHLORIDE: 109 mmol/L (ref 98–111)
CO2: 25 mmol/L (ref 22–32)
CREATININE: 0.61 mg/dL (ref 0.44–1.00)
Calcium: 9.2 mg/dL (ref 8.9–10.3)
GFR calc Af Amer: >60 mL/min (ref >60)
GFR calc non Af Amer: >60 mL/min (ref >60)
Glucose, Bld: 94 mg/dL (ref 70–99)
POTASSIUM: 4.1 mmol/L (ref 3.5–5.1)
SODIUM: 143 mmol/L (ref 135–145)
Total Protein: 7.5 g/dL (ref 6.5–8.1)

## 2018-07-01 LAB — LIPASE, BLOOD: Lipase: 116 U/L — ABNORMAL HIGH (ref 11–51)

## 2018-07-01 MED ORDER — ESOMEPRAZOLE MAGNESIUM 40 MG PO CPDR: 40.0000 mg | DELAYED_RELEASE_CAPSULE | Freq: Every day | ORAL | 0 refills | Status: AC

## 2018-07-01 MED ORDER — ONDANSETRON 8 MG PO TBDP: 8.0000 mg | ORAL_TABLET | Freq: Three times a day (TID) | ORAL | 0 refills | Status: DC | PRN

## 2018-07-01 NOTE — ED Provider Notes (Signed)
HPI  SUBJECTIVE:  Margaret Bryan is a 57 y.o. female who presents with 6 days of radiating, nonmigratory epigastric pain described as dull, constant, accompanied with diffuse abdominal cramping that lasts hours after eating.  She states that initially the pain was happening only at night, she now states that happens with every meal.  States that she feels hungry all the time.  She reports nausea, sensation of feeling bloated.  She is having normal bowel movements, last stool was yesterday..  States that she is belching a lot.  She tried a bland diet with improvement in her symptoms, Tums and Phenergan.  Symptoms are worse with eating all foods.  Denies vomiting, fevers, abdominal distention.  No melena, hematochezia, diarrhea.  No excess NSAID use, EtOH use.  She has never had symptoms like this before.  She has been on a low-carb diet for the past 6 months.  Past medical history positive for GERD for which she takes omeprazole, esophageal stricture status post stretching.  No history of food allergies, mesenteric ischemia, atrial fibrillation, hypercoagulability, abdominal surgery, pancreatitis, peptic ulcer disease, H. pylori infection, diabetes, hypertension, sickle cell anemia, exogenous estrogen.  PMD: [name], MD   Past Medical History:  Diagnosis Date  . GERD (gastroesophageal reflux disease)   . Heart murmur   . Migraine     Past Surgical History:  Procedure Laterality Date  . ABDOMINAL HYSTERECTOMY    . NASAL SINUS SURGERY      Family History  Problem Relation Age of Onset  . Heart failure Mother   . Hypertension Mother   . Heart failure Father   . Hypertension Father   . Breast cancer Maternal Aunt 35  . Breast cancer Maternal Grandmother 66  . Breast cancer Cousin 20    Social History   Tobacco Use  . Smoking status: Never Smoker  . Smokeless tobacco: Never Used  Substance Use Topics  . Alcohol use: No  . Drug use: Never    No current facility-administered  medications for this encounter.   Current Outpatient Medications:  .  eletriptan (RELPAX) 20 MG tablet, Take 20 mg by mouth as needed for migraine or headache. May repeat in 2 hours if headache persists or recurs., Disp: , Rfl:  .  Magnesium 250 MG TABS, Take by mouth., Disp: , Rfl:  .  meclizine (ANTIVERT) 12.5 MG tablet, Take by mouth., Disp: , Rfl:  .  Multiple Vitamins-Minerals (ONE-A-DAY 50 PLUS PO), Take by mouth., Disp: , Rfl:  .  topiramate (TOPAMAX) 100 MG tablet, Take 100 mg by mouth 2 (two) times daily., Disp: , Rfl:  .  esomeprazole (NEXIUM) 40 MG capsule, Take 1 capsule (40 mg total) by mouth daily., Disp: 30 capsule, Rfl: 0 .  ondansetron (ZOFRAN-ODT) 8 MG disintegrating tablet, Take 1 tablet (8 mg total) by mouth every 8 (eight) hours as needed for nausea or vomiting., Disp: 20 tablet, Rfl: 0  Allergies  Allergen Reactions  . Imitrex [Sumatriptan]   . Percocet [Oxycodone-Acetaminophen] Other (See Comments)    Hallucinations, rapid heartbeat  . Percocet [Oxycodone-Acetaminophen]      ROS  As noted in HPI.   Physical Exam  BP (!) 149/82 (BP Location: Left Arm)   Pulse 70   Temp 98 F (36.7 C) (Oral)   Resp 16   Ht 5\' 8"  (1.727 m)   Wt 84.8 kg   SpO2 100%   BMI 28.43 kg/m   Constitutional: Well developed, well nourished, no acute distress Eyes:  EOMI, conjunctiva normal bilaterally HENT: Normocephalic, atraumatic,mucus membranes moist Respiratory: Normal inspiratory effort Cardiovascular: Normal rate, regular rhythm, no murmurs, rubs, gallops GI: Soft.  Positive epigastric tenderness, mild left upper quadrant, mild periumbilical tenderness.  No guarding, rebound.  No distention.  Active bowel sounds.  Negative tap table test.  Negative McBurney, negative Murphy.  Patient moving around comfortably. Back: No CVAT. skin: No rash, skin intact Musculoskeletal: no deformities Neurologic: Alert & oriented x 3, no focal neuro deficits Psychiatric: Speech and  behavior appropriate   ED Course   Medications - No data to display  Orders Placed This Encounter  Procedures  . CBC with Differential    Standing Status:   Standing    Number of Occurrences:   1  . Comprehensive metabolic panel    Standing Status:   Standing    Number of Occurrences:   1  . Lipase, blood    Standing Status:   Standing    Number of Occurrences:   1    Results for orders placed or performed during the hospital encounter of 07/01/18 (from the past 24 hour(s))  CBC with Differential     Status: None   Collection Time: 07/01/18  2:44 PM  Result Value Ref Range   WBC 5.1 4.0 - 10.5 K/uL   RBC 4.75 3.87 - 5.11 MIL/uL   Hemoglobin 14.1 12.0 - 15.0 g/dL   HCT 44.2 36.0 - 46.0 %   MCV 93.1 80.0 - 100.0 fL   MCH 29.7 26.0 - 34.0 pg   MCHC 31.9 30.0 - 36.0 g/dL   RDW 13.1 11.5 - 15.5 %   Platelets 232 150 - 400 K/uL   nRBC 0.0 0.0 - 0.2 %   Neutrophils Relative % 49 %   Neutro Abs 2.5 1.7 - 7.7 K/uL   Lymphocytes Relative 41 %   Lymphs Abs 2.1 0.7 - 4.0 K/uL   Monocytes Relative 7 %   Monocytes Absolute 0.3 0.1 - 1.0 K/uL   Eosinophils Relative 2 %   Eosinophils Absolute 0.1 0.0 - 0.5 K/uL   Basophils Relative 1 %   Basophils Absolute 0.0 0.0 - 0.1 K/uL   Immature Granulocytes 0 %   Abs Immature Granulocytes 0.01 0.00 - 0.07 K/uL  Comprehensive metabolic panel     Status: None   Collection Time: 07/01/18  2:44 PM  Result Value Ref Range   Sodium 143 135 - 145 mmol/L   Potassium 4.1 3.5 - 5.1 mmol/L   Chloride 109 98 - 111 mmol/L   CO2 25 22 - 32 mmol/L   Glucose, Bld 94 70 - 99 mg/dL   BUN 11 6 - 20 mg/dL   Creatinine, Ser 0.61 0.44 - 1.00 mg/dL   Calcium 9.2 8.9 - 10.3 mg/dL   Total Protein 7.5 6.5 - 8.1 g/dL   Albumin 4.4 3.5 - 5.0 g/dL   AST 22 15 - 41 U/L   ALT 19 0 - 44 U/L   Alkaline Phosphatase 99 38 - 126 U/L   Total Bilirubin 0.9 0.3 - 1.2 mg/dL   GFR calc non Af Amer >60 >60 mL/min   GFR calc Af Amer >60 >60 mL/min   Anion gap 9 5 - 15   Lipase, blood     Status: Abnormal   Collection Time: 07/01/18  2:44 PM  Result Value Ref Range   Lipase 116 (H) 11 - 51 U/L   No results found.  ED Clinical Impression  Epigastric pain  ED Assessment/Plan  CBC, CMP, lipase.  CMP, CBC normal.  Lipase elevated, but not diagnostic of pancreatitis.  No evidence of a surgical abdomen.  Does not appear to be cholecystitis, perforation, obstruction.  Mesenteric ischemia in the differential and discussed this with patient and spouse, but she has no risk factors for this.  Therefore imaging was deferred today.  Presentation is most consistent with peptic ulcer disease.  Zofran as needed for nausea. change her PPI from omeprazole to esomaprazole 40 mg qd, start her on H2 blocker such as Pepcid, liquid diet advance as tolerated, and follow up with GI in 3 days.  Dr. Vicente Males on call.  Or she may follow-up with her gastroenterologist ASAP.  Giving her very strict ER return precautions.  Discussed labs,  MDM, treatment plan, and plan for follow-up with patient. Discussed sn/sx that should prompt return to the ED. patient agrees with plan.   Meds ordered this encounter  Medications  . esomeprazole (NEXIUM) 40 MG capsule    Sig: Take 1 capsule (40 mg total) by mouth daily.    Dispense:  30 capsule    Refill:  0  . ondansetron (ZOFRAN-ODT) 8 MG disintegrating tablet    Sig: Take 1 tablet (8 mg total) by mouth every 8 (eight) hours as needed for nausea or vomiting.    Dispense:  20 tablet    Refill:  0    *This clinic note was created using [employer]. Therefore, there may be occasional mistakes despite careful proofreading.   ?   Melynda Ripple, MD 07/01/18 2116

## 2018-07-01 NOTE — Discharge Instructions (Addendum)
Your lipase was slightly elevated, but it is not diagnostic of pancreatitis.  Stop the omeprazole and try the omeprazole.  I would add Pepcid to this as well.  Start a liquid diet, advance to bland diet as tolerated and follow-up with GI as soon as you possibly can.  Go immediately to the ER for fevers above 100.4, for abdominal pain that is not controlled with Tylenol/ibuprofen, vomiting that is not controlled with the Zofran, blood in your stool or black and tarry stools, abdominal distention, chest pain, shortness of breath, or for any other concerns.

## 2018-07-01 NOTE — ED Triage Notes (Addendum)
Patient in today c/o abdominal pain and nausea x 5 days. Patient states it started after eating, but now hurts all the time. Patient has been eating a bland diet or not much at all and that has helped slightly. Patient denies urinary frequency or dysuria.

## 2018-07-02 ENCOUNTER — Other Ambulatory Visit: Payer: Self-pay | Admitting: Student

## 2018-07-02 ENCOUNTER — Ambulatory Visit
Admission: RE | Admit: 2018-07-02 | Discharge: 2018-07-02 | Disposition: A | Discharge status: Home / Self Care | Payer: PRIVATE HEALTH INSURANCE | Source: Ambulatory Visit | Attending: Student | Admitting: Student

## 2018-07-02 DIAGNOSIS — R748 Abnormal levels of other serum enzymes: Secondary | ICD-10-CM | POA: Insufficient documentation

## 2018-07-02 DIAGNOSIS — R1013 Epigastric pain: Secondary | ICD-10-CM

## 2018-07-02 DIAGNOSIS — R1011 Right upper quadrant pain: Secondary | ICD-10-CM | POA: Diagnosis not present

## 2018-07-02 MED ORDER — IOPAMIDOL (ISOVUE-300) INJECTION 61%
100.0000 mL | Freq: Once | INTRAVENOUS | Status: AC | PRN
  Administered 2018-07-02: 100 mL via INTRAVENOUS

## 2018-08-20 ENCOUNTER — Other Ambulatory Visit: Payer: Self-pay | Admitting: Student

## 2018-08-20 DIAGNOSIS — Z8719 Personal history of other diseases of the digestive system: Secondary | ICD-10-CM

## 2018-08-20 DIAGNOSIS — R1011 Right upper quadrant pain: Secondary | ICD-10-CM

## 2018-08-20 DIAGNOSIS — R110 Nausea: Secondary | ICD-10-CM

## 2018-08-20 DIAGNOSIS — R1013 Epigastric pain: Secondary | ICD-10-CM

## 2018-08-21 ENCOUNTER — Other Ambulatory Visit: Payer: Self-pay | Admitting: Student

## 2018-08-21 ENCOUNTER — Ambulatory Visit
Admission: RE | Admit: 2018-08-21 | Discharge: 2018-08-21 | Disposition: A | Discharge status: Home / Self Care | Payer: PRIVATE HEALTH INSURANCE | Source: Ambulatory Visit | Attending: Student | Admitting: Student

## 2018-08-21 DIAGNOSIS — R1011 Right upper quadrant pain: Secondary | ICD-10-CM

## 2018-08-21 DIAGNOSIS — Q453 Other congenital malformations of pancreas and pancreatic duct: Secondary | ICD-10-CM | POA: Diagnosis not present

## 2018-08-21 DIAGNOSIS — Z8719 Personal history of other diseases of the digestive system: Secondary | ICD-10-CM | POA: Diagnosis not present

## 2018-08-21 DIAGNOSIS — R110 Nausea: Secondary | ICD-10-CM

## 2018-08-21 DIAGNOSIS — R1013 Epigastric pain: Secondary | ICD-10-CM

## 2018-08-21 MED ORDER — GADOBUTROL 1 MMOL/ML IV SOLN
8.5000 mL | Freq: Once | INTRAVENOUS | Status: AC | PRN
  Administered 2018-08-21: 8.5 mL via INTRAVENOUS

## 2018-12-06 ENCOUNTER — Encounter: Payer: PRIVATE HEALTH INSURANCE | Admitting: Certified Nurse Midwife

## 2018-12-10 ENCOUNTER — Encounter: Payer: PRIVATE HEALTH INSURANCE | Admitting: Certified Nurse Midwife

## 2019-03-29 ENCOUNTER — Telehealth: Payer: PRIVATE HEALTH INSURANCE | Admitting: Physician Assistant

## 2019-03-29 DIAGNOSIS — Z20822 Contact with and (suspected) exposure to covid-19: Secondary | ICD-10-CM

## 2019-03-29 NOTE — Progress Notes (Signed)
E-Visit for Corona Virus Screening   Your current symptoms could be consistent with the coronavirus.  Many health care providers can now test patients at their office but not all are.  [facility] has multiple testing sites. For information on our COVID testing locations and hours go to [email or web address]  Please quarantine yourself while awaiting your test results.  COVID-19 is a respiratory illness with symptoms that are similar to the flu. Symptoms are typically mild to moderate, but there have been cases of severe illness and death due to the virus. The following symptoms may appear 2-14 days after exposure: . Fever . Cough . Shortness of breath or difficulty breathing . Chills . Repeated shaking with chills . Muscle pain . Headache . Sore throat . New loss of taste or smell . Fatigue . Congestion or runny nose . Nausea or vomiting . Diarrhea  It is vitally important that if you feel that you have an infection such as this virus or any other virus that you stay home and away from places where you may spread it to others.  You should self-quarantine for 14 days if you have symptoms that could potentially be coronavirus or have been in close contact a with a person diagnosed with COVID-19 within the last 2 weeks. You should avoid contact with people age 56 and older.   You should wear a mask or cloth face covering over your nose and mouth if you must be around other people or animals, including pets (even at home). Try to stay at least 6 feet away from other people. This will protect the people around you.   You may also take acetaminophen (Tylenol) as needed for fever.   Reduce your risk of any infection by using the same precautions used for avoiding the common cold or flu:  Marland Kitchen Wash your hands often with soap and warm water for at least 20 seconds.  If soap and water are not readily available, use an alcohol-based hand sanitizer with at least 60% alcohol.   . If coughing or sneezing, cover your mouth and nose by coughing or sneezing into the elbow areas of your shirt or coat, into a tissue or into your sleeve (not your hands). . Avoid shaking hands with others and consider head nods or verbal greetings only. . Avoid touching your eyes, nose, or mouth with unwashed hands.  . Avoid close contact with people who are sick. . Avoid places or events with large numbers of people in one location, like concerts or sporting events. . Carefully consider travel plans you have or are making. . If you are planning any travel outside or inside the [place], visit the CDC's Travelers' Health webpage for the latest health notices. . If you have some symptoms but not all symptoms, continue to monitor at home and seek medical attention if your symptoms worsen. . If you are having a medical emergency, call 911.  HOME CARE . Only take medications as instructed by your medical team. . Drink plenty of fluids and get plenty of rest. . A steam or ultrasonic humidifier can help if you have congestion.   GET HELP RIGHT AWAY IF YOU HAVE EMERGENCY WARNING SIGNS** FOR COVID-19. If you or someone is showing any of these signs seek emergency medical care immediately. Call 911 or proceed to your closest emergency facility if: . You develop worsening high fever. . Trouble breathing . Bluish lips or face . Persistent pain or pressure in the chest . New confusion .  Inability to wake or stay awake . You cough up blood. . Your symptoms become more severe  **This list is not all possible symptoms. Contact your medical provider for any symptoms that are sever or concerning to you.   MAKE SURE YOU   Understand these instructions.  Will watch your condition.  Will get help right away if you are not doing well or get worse.  Your e-visit answers were reviewed by a board certified advanced clinical practitioner to complete your personal care plan.  Depending on the condition, your  plan could have included both over the counter or prescription medications.  If there is a problem please reply once you have received a response from your provider.  Your safety is important to [place].  If you have drug allergies check your prescription carefully.    You can use MyChart to ask questions about today's visit, request a non-urgent call back, or ask for a work or school excuse for 24 hours related to this e-Visit. If it has been greater than 24 hours you will need to follow up with your provider, or enter a new e-Visit to address those concerns. You will get an e-mail in the next two days asking about your experience.  I hope that your e-visit has been valuable and will speed your recovery. Thank you for using e-visits.

## 2019-03-29 NOTE — Progress Notes (Signed)
I have spent 5 minutes in review of e-visit questionnaire, review and updating patient chart, medical decision making and response to patient.   Makiyah Zentz Cody Almina Schul, PA-C    

## 2019-03-31 ENCOUNTER — Other Ambulatory Visit: Payer: Self-pay

## 2019-03-31 DIAGNOSIS — Z20822 Contact with and (suspected) exposure to covid-19: Secondary | ICD-10-CM

## 2019-04-02 LAB — NOVEL CORONAVIRUS, NAA: SARS-CoV-2, NAA: NOT DETECTED

## 2019-06-05 ENCOUNTER — Other Ambulatory Visit: Payer: Self-pay

## 2019-06-05 ENCOUNTER — Encounter: Payer: Self-pay | Admitting: Emergency Medicine

## 2019-06-05 ENCOUNTER — Emergency Department
Admission: EM | Admit: 2019-06-05 | Discharge: 2019-06-05 | Disposition: A | Discharge status: Home / Self Care | Payer: PRIVATE HEALTH INSURANCE | Attending: Emergency Medicine | Admitting: Emergency Medicine

## 2019-06-05 DIAGNOSIS — R040 Epistaxis: Secondary | ICD-10-CM | POA: Insufficient documentation

## 2019-06-05 DIAGNOSIS — R011 Cardiac murmur, unspecified: Secondary | ICD-10-CM | POA: Insufficient documentation

## 2019-06-05 DIAGNOSIS — R030 Elevated blood-pressure reading, without diagnosis of hypertension: Secondary | ICD-10-CM | POA: Diagnosis not present

## 2019-06-05 DIAGNOSIS — Z885 Allergy status to narcotic agent status: Secondary | ICD-10-CM | POA: Diagnosis not present

## 2019-06-05 DIAGNOSIS — Z79899 Other long term (current) drug therapy: Secondary | ICD-10-CM | POA: Diagnosis not present

## 2019-06-05 HISTORY — DX: Other congenital malformations of pancreas and pancreatic duct: Q45.3

## 2019-06-05 LAB — CBC
HCT: 43.3 % (ref 36.0–46.0)
Hemoglobin: 13.9 g/dL (ref 12.0–15.0)
MCH: 30.2 pg (ref 26.0–34.0)
MCHC: 32.1 g/dL (ref 30.0–36.0)
MCV: 94.1 fL (ref 80.0–100.0)
Platelets: 257 10*3/uL (ref 150–400)
RBC: 4.6 MIL/uL (ref 3.87–5.11)
RDW: 13.1 % (ref 11.5–15.5)
WBC: 5.2 10*3/uL (ref 4.0–10.5)
nRBC: 0 % (ref 0.0–0.2)

## 2019-06-05 LAB — BASIC METABOLIC PANEL
Anion gap: 8 (ref 5–15)
BUN: 12 mg/dL (ref 6–20)
CO2: 23 mmol/L (ref 22–32)
Calcium: 9 mg/dL (ref 8.9–10.3)
Chloride: 110 mmol/L (ref 98–111)
Creatinine, Ser: 0.52 mg/dL (ref 0.44–1.00)
GFR calc Af Amer: >60 mL/min (ref >60)
GFR calc non Af Amer: >60 mL/min (ref >60)
Glucose, Bld: 90 mg/dL (ref 70–99)
Potassium: 3.6 mmol/L (ref 3.5–5.1)
Sodium: 141 mmol/L (ref 135–145)

## 2019-06-05 LAB — PROTIME-INR
INR: 0.9 (ref 0.8–1.2)
Prothrombin Time: 11.9 seconds (ref 11.4–15.2)

## 2019-06-05 LAB — TROPONIN I (HIGH SENSITIVITY): Troponin I (High Sensitivity): 6 ng/L (ref <18)

## 2019-06-05 MED ORDER — OXYMETAZOLINE HCL 0.05 % NA SOLN
1.0000 | Freq: Once | NASAL | Status: AC
  Administered 2019-06-05: 1 via NASAL
  Filled 2019-06-05: qty 30

## 2019-06-05 NOTE — ED Provider Notes (Signed)
[facility] Emergency Department Provider Note  ____________________________________________  Time seen: Approximately 12:19 PM  I have reviewed the triage vital signs and the nursing notes.   HISTORY  Chief Complaint Epistaxis    HPI Margaret Bryan is a 58 y.o. female that presents to the department for evaluation of epistasis for about 45 minutes.  She states that nosebleed was steady.  She remembers being told previously to lean back when you have a nosebleed so she was trying this maneuver and she could feel blood go down the back of her throat and coughed some up.  Bleeding stopped shortly after arriving to the emergency department.   Patient was checking the progress of her house city building site when nosebleed started.  She went to the fire department, who recommended that she call her primary care and primary care recommended that she come to the emergency department.  No history of hypertension.  No history of nosebleeds.  She felt slightly dizzy after nosebleed started but this resolved.  No trauma.  No blood thinners.  Patient has seasonal allergies.  No SOB, CP.  Past Medical History:  Diagnosis Date  . GERD (gastroesophageal reflux disease)   . Heart murmur   . Migraine   . Pancreas divisum     Patient Active Problem List   Diagnosis Date Noted  . H/O: hysterectomy 03/22/2018    Past Surgical History:  Procedure Laterality Date  . ABDOMINAL HYSTERECTOMY    . NASAL SINUS SURGERY      Prior to Admission medications   Medication Sig Start Date End Date Taking? Authorizing Provider  eletriptan (RELPAX) 20 MG tablet Take 20 mg by mouth as needed for migraine or headache. May repeat in 2 hours if headache persists or recurs.    [provider]  esomeprazole (NEXIUM) 40 MG capsule Take 1 capsule (40 mg total) by mouth daily. 07/01/18   Melynda Ripple, MD  Magnesium 250 MG TABS Take by mouth.    [provider]  meclizine  (ANTIVERT) 12.5 MG tablet Take by mouth. 07/19/16   [provider]  Multiple Vitamins-Minerals (ONE-A-DAY 50 PLUS PO) Take by mouth.    [provider]  ondansetron (ZOFRAN-ODT) 8 MG disintegrating tablet Take 1 tablet (8 mg total) by mouth every 8 (eight) hours as needed for nausea or vomiting. 07/01/18   Melynda Ripple, MD  topiramate (TOPAMAX) 100 MG tablet Take 100 mg by mouth 2 (two) times daily.    [provider]    Allergies Imitrex [sumatriptan], Percocet [oxycodone-acetaminophen], and Percocet [oxycodone-acetaminophen]  Family History  Problem Relation Age of Onset  . Heart failure Mother   . Hypertension Mother   . Heart failure Father   . Hypertension Father   . Breast cancer Maternal Aunt 35  . Breast cancer Maternal Grandmother 44  . Breast cancer Cousin 20    Social History Social History   Tobacco Use  . Smoking status: Never Smoker  . Smokeless tobacco: Never Used  Substance Use Topics  . Alcohol use: No  . Drug use: Never     Review of Systems  Constitutional: No fever/chills ENT: No upper respiratory complaints. Cardiovascular: No chest pain. Respiratory: No SOB. Gastrointestinal: No abdominal pain.  No nausea, no vomiting.  Musculoskeletal: Negative for musculoskeletal pain. Skin: Negative for rash, abrasions, lacerations, ecchymosis.   ____________________________________________   PHYSICAL EXAM:  VITAL SIGNS: ED Triage Vitals  Enc Vitals Group     BP 06/05/19 1156 (!) 156/96  Pulse Rate 06/05/19 1156 83     Resp 06/05/19 1156 14     Temp 06/05/19 1156 98.2 F (36.8 C)     Temp Source 06/05/19 1156 Oral     SpO2 06/05/19 1156 97 %     Weight 06/05/19 1152 190 lb (86.2 kg)     Height 06/05/19 1152 5\' 8"  (1.727 m)     Head Circumference --      Peak Flow --      Pain Score 06/05/19 1152 0     Pain Loc --      Pain Edu? --      Excl. in [place]? --      Constitutional: Alert and oriented. Well appearing  and in no acute distress. Eyes: Conjunctivae are normal. PERRL. EOMI. Head: Atraumatic. ENT:      Ears:      Nose: No congestion/rhinnorhea. Minimal blood in right nasal passage. No active bleeding.      Mouth/Throat: Mucous membranes are moist. No blood in oropharynx. Neck: No stridor. Cardiovascular: Normal rate, regular rhythm.  Good peripheral circulation. Respiratory: Normal respiratory effort without tachypnea or retractions. Lungs CTAB. Good air entry to the bases with no decreased or absent breath sounds. Musculoskeletal: Full range of motion to all extremities. No gross deformities appreciated. Neurologic:  Normal speech and language. No gross focal neurologic deficits are appreciated.  Skin:  Skin is warm, dry and intact. No rash noted. Psychiatric: Mood and affect are normal. Speech and behavior are normal. Patient exhibits appropriate insight and judgement.   ____________________________________________   LABS (all labs ordered are listed, but only abnormal results are displayed)  Labs Reviewed  CBC  BASIC METABOLIC PANEL  PROTIME-INR  TROPONIN I (HIGH SENSITIVITY)  TROPONIN I (HIGH SENSITIVITY)   ____________________________________________  EKG  NSR  ____________________________________________  RADIOLOGY   No results found.  ____________________________________________    PROCEDURES  Procedure(s) performed:    Procedures    Medications  oxymetazoline (AFRIN) 0.05 % nasal spray 1 spray (1 spray Each Nare Given by Other 06/05/19 1233)     ____________________________________________   INITIAL IMPRESSION / ASSESSMENT AND PLAN / ED COURSE  Pertinent labs & imaging results that were available during my care of the patient were reviewed by me and considered in my medical decision making (see chart for details).  Review of the [place] CSRS was performed in accordance of the [place] prior to dispensing any controlled drugs.   Patient's diagnosis is  consistent with epistaxis.  Vital signs and exam are reassuring.  Lab work all within normal limits.  EKG shows normal sinus rhythm.  The patients nosebleed stopped shortly after arriving to the emergency department.  She was given Afrin to prevent additional bleeding.  Blood pressure was mildly elevated in the emergency department.  Up to date does not recommend antihypertensives for epistaxis in the absence of hypertension emergency.  Patient will follow-up with primary care for blood pressure recheck tomorrow or early next week.  She will return the emergency department for any change or additional symptoms. She is comfortable going home. Patient is to follow up with PCP as directed. Patient is given ED precautions to return to the ED for any worsening or new symptoms.  Margaret Bryan was evaluated in Emergency Department on 06/05/2019 for the symptoms described in the history of present illness. She was evaluated in the context of the global COVID-19 pandemic, which necessitated consideration that the patient might be at risk for infection with the SARS-CoV-2  virus that causes COVID-19. Institutional protocols and algorithms that pertain to the evaluation of patients at risk for COVID-19 are in a state of rapid change based on information released by regulatory bodies including the CDC and federal and state organizations. These policies and algorithms were followed during the patient's care in the ED.   ____________________________________________  FINAL CLINICAL IMPRESSION(S) / ED DIAGNOSES  Final diagnoses:  Epistaxis  Elevated blood pressure reading      NEW MEDICATIONS STARTED DURING THIS VISIT:  ED Discharge Orders    None          This chart was dictated using voice recognition software/Dragon. Despite best efforts to proofread, errors can occur which can change the meaning. Any change was purely unintentional.    Laban Emperor, PA-C 06/05/19 1756    Nance Pear,  MD 06/08/19 [phone]

## 2019-06-05 NOTE — ED Notes (Signed)
See triage note  Presents with nose bleed which started this am   Denies any trauma   States she was out checking on progress of her house at building site

## 2019-06-05 NOTE — ED Triage Notes (Signed)
Nosebleed 45 minutes.  Has stopped now, after she got a clot out.  Nose clip applied.

## 2019-06-05 NOTE — Discharge Instructions (Addendum)
Please follow-up with your primary care provider tomorrow.  Please return the emergency department for any change or worsening of symptoms.

## 2019-06-23 ENCOUNTER — Other Ambulatory Visit: Payer: Self-pay | Admitting: Family Medicine

## 2019-06-23 DIAGNOSIS — Z1231 Encounter for screening mammogram for malignant neoplasm of breast: Secondary | ICD-10-CM

## 2019-07-23 ENCOUNTER — Other Ambulatory Visit: Payer: Self-pay

## 2019-07-23 ENCOUNTER — Ambulatory Visit
Admission: RE | Admit: 2019-07-23 | Discharge: 2019-07-23 | Disposition: A | Discharge status: Home / Self Care | Payer: PRIVATE HEALTH INSURANCE | Source: Ambulatory Visit | Attending: Family Medicine | Admitting: Family Medicine

## 2019-07-23 DIAGNOSIS — Z1231 Encounter for screening mammogram for malignant neoplasm of breast: Secondary | ICD-10-CM | POA: Insufficient documentation

## 2019-11-22 ENCOUNTER — Ambulatory Visit: Payer: PRIVATE HEALTH INSURANCE | Attending: Internal Medicine

## 2019-11-22 DIAGNOSIS — Z23 Encounter for immunization: Secondary | ICD-10-CM

## 2019-11-22 NOTE — Progress Notes (Signed)
   Covid-19 Vaccination Clinic  Name:  Margaret Bryan    MRN: [id number] DOB: [birth year]  11/22/2019  [name] was observed post Covid-19 immunization for 15 minutes without incident. She was provided with Vaccine Information Sheet and instruction to access the V-Safe system.   [name] was instructed to call 911 with any severe reactions post vaccine: Marland Kitchen Difficulty breathing  . Swelling of face and throat  . A fast heartbeat  . A bad rash all over body  . Dizziness and weakness   Immunizations Administered    Name Date Dose VIS Date Route   Pfizer COVID-19 Vaccine 11/22/2019 11:40 AM 0.3 mL 08/22/2019 Intramuscular   Manufacturer: [place]   Lot: [id number]   [place]: [id number]

## 2019-12-02 ENCOUNTER — Other Ambulatory Visit: Payer: Self-pay

## 2019-12-02 ENCOUNTER — Ambulatory Visit
Admission: EM | Admit: 2019-12-02 | Discharge: 2019-12-02 | Disposition: A | Discharge status: Home / Self Care | Payer: PRIVATE HEALTH INSURANCE

## 2019-12-02 DIAGNOSIS — L089 Local infection of the skin and subcutaneous tissue, unspecified: Secondary | ICD-10-CM

## 2019-12-02 DIAGNOSIS — H6121 Impacted cerumen, right ear: Secondary | ICD-10-CM

## 2019-12-02 DIAGNOSIS — H6691 Otitis media, unspecified, right ear: Secondary | ICD-10-CM

## 2019-12-02 DIAGNOSIS — B9689 Other specified bacterial agents as the cause of diseases classified elsewhere: Secondary | ICD-10-CM

## 2019-12-02 MED ORDER — AMOXICILLIN-POT CLAVULANATE 875-125 MG PO TABS: 1.0000 | ORAL_TABLET | Freq: Two times a day (BID) | ORAL | 0 refills | Status: AC

## 2019-12-02 MED ORDER — NEOMYCIN-POLYMYXIN-HC 3.5-10000-1 OT SUSP: 4.0000 [drp] | Freq: Three times a day (TID) | OTIC | 0 refills | Status: DC

## 2019-12-02 NOTE — Discharge Instructions (Addendum)
It was very nice seeing you today in clinic. Thank you for entrusting me with your care.   Keep ear clean and dry. Apply topical antibiotic (Neosporin) to area behind ear and to area in scalp. Use oral antibiotics as prescribed; complete entire course. Also, use ear drops as prescribed - they should help with the pain inside of the ear as well. May use Tylenol and/or Ibuprofen as needed for pain. Call us back if you are not improving.   Make arrangements to follow up with your regular doctor in 1 week for re-evaluation if not improving. If your symptoms/condition worsens, please seek follow up care either here or in the ER. Please remember, our [place] providers are "right here with you" when you need [place].   Again, it was my pleasure to take care of you today. Thank you for choosing our clinic. I hope that you start to feel better quickly.   Honor Loh, MSN, APRN, FNP-C, CEN Advanced Practice Provider [facility]

## 2019-12-02 NOTE — ED Triage Notes (Signed)
Pt presents with c/o small sore under/behind her right ear, she states the area is painful and is causing swelling to her face/neck along with pain to her ear. She states the area did seem to drain some overnight. She also has a similar sore on her scalp. She denies any known injuries or insect bites.

## 2019-12-04 NOTE — ED Provider Notes (Signed)
[place], [place]   Name: Margaret Bryan DOB: 29-Oct-1960 MRN: [id number] CSN: [id number] PCP: [name], MD  Arrival date and time:  12/02/19 1550  Chief Complaint:  Otalgia  NOTE: Prior to seeing the patient today, I have reviewed the triage nursing documentation and vital signs. Clinical staff has updated patient's PMH/PSHx, current medication list, and drug allergies/intolerances to ensure comprehensive history available to assist in medical decision making.   History:   HPI: Margaret Bryan is a 59 y.o. female who presents today with complaints of pain and swelling in the RIGHT ear, face, and neck. Symptoms started on Sunday (11/30/19). Patient notes that she has a small "sore" behind her RIGHT ear and to the base of her posterior hair line on the LEFT side. Area behind the ear with (+) draining noted. She reports that has appreciated some drainage from the area; woke this morning with some exudative crusting. Patient notes pain with movement of her external ear structures. Patient denies fever, chills, or other systemic signs of infection; no hypotension, tachycardia, nausea, vomiting, or vertiginous symptoms. Despite her symptoms, patient has not taken any over the counter interventions to help improve/relieve her reported symptoms at home.   Past Medical History:  Diagnosis Date  . GERD (gastroesophageal reflux disease)   . Heart murmur   . Migraine   . Pancreas divisum     Past Surgical History:  Procedure Laterality Date  . ABDOMINAL HYSTERECTOMY    . NASAL SINUS SURGERY      Family History  Problem Relation Age of Onset  . Heart failure Mother   . Hypertension Mother   . Heart failure Father   . Hypertension Father   . Breast cancer Maternal Aunt 35  . Breast cancer Maternal Grandmother 31  . Breast cancer Cousin 20    Social History   Tobacco Use  . Smoking status: Never Smoker  . Smokeless tobacco: Never Used  Substance Use Topics  . Alcohol use: No  .  Drug use: Never    Patient Active Problem List   Diagnosis Date Noted  . H/O: hysterectomy 03/22/2018    Home Medications:    Current Meds  Medication Sig  . eletriptan (RELPAX) 20 MG tablet Take 20 mg by mouth as needed for migraine or headache. May repeat in 2 hours if headache persists or recurs.  Marland Kitchen esomeprazole (NEXIUM) 40 MG capsule Take 1 capsule (40 mg total) by mouth daily.  . Magnesium 250 MG TABS Take by mouth.  . meclizine (ANTIVERT) 12.5 MG tablet Take by mouth.  . Multiple Vitamins-Minerals (ONE-A-DAY 50 PLUS PO) Take by mouth.  . ondansetron (ZOFRAN-ODT) 8 MG disintegrating tablet Take 1 tablet (8 mg total) by mouth every 8 (eight) hours as needed for nausea or vomiting.  . topiramate (TOPAMAX) 100 MG tablet Take 100 mg by mouth 2 (two) times daily.    Allergies:   Imitrex [sumatriptan] and Percocet [oxycodone-acetaminophen]  Review of Systems (ROS):  Review of systems NEGATIVE unless otherwise noted in narrative H&P section.   Vital Signs: Today's Vitals   12/02/19 1603 12/02/19 1607 12/02/19 1634  BP:  134/87   Pulse:  70   Resp:  18   Temp:  98.1 F (36.7 C)   TempSrc:  Oral   SpO2:  99%   Weight: 200 lb (90.7 kg)    Height: 5\' 8"  (1.727 m)    PainSc: 6   6     Physical Exam: Physical Exam  Constitutional: She  is oriented to person, place, and time and well-developed, well-nourished, and in no distress.  HENT:  Head: Normocephalic and atraumatic.  Right Ear: There is tenderness.  Left Ear: Tympanic membrane normal.  RIGHT ear with EAC totally obscured by cerumen. Unable to visualize TM (pre-procedural). (+) pain with movement of the auricle and tragus. (+) post auricular tenderness and swelling extending into lateral neck.   Eyes: Pupils are equal, round, and reactive to light.  Cardiovascular: Normal rate and intact distal pulses.  Pulmonary/Chest: Effort normal. No respiratory distress.  Lymphadenopathy:       Head (right side): Submandibular  and posterior auricular adenopathy present.  Neurological: She is alert and oriented to person, place, and time. Gait normal.  Skin: Skin is warm and dry. Lesion noted. No rash noted. She is not diaphoretic. There is erythema.     Psychiatric: Mood, memory, affect and judgment normal.  Nursing note and vitals reviewed.   Urgent Care Treatments / Results:   Orders Placed This Encounter  Procedures  . ED EAR CERUMEN REMOVAL    LABS: PLEASE NOTE: all labs that were ordered this encounter are listed, however only abnormal results are displayed. Labs Reviewed - No data to display  EKG: -None  RADIOLOGY: No results found.  PROCEDURES: Ear Cerumen Removal Performed by: Karen Kitchens, NP Authorized by: Karen Kitchens, NP   Consent:    Consent obtained:  Verbal   Risks discussed:  Bleeding, dizziness, infection, incomplete removal, pain and TM perforation   Alternatives discussed:  Observation, delayed treatment and no treatment Procedure details:    Location:  R ear   Procedure type comment:  Warm water lavage followed by gentle disimpaction attempt with loop currette. Post-procedure details:    Post-procedure ear inspection: EAC erythematous and bleeding with (+) macerated tissue noted. TM erythematous with slight bulge.   Hearing quality:  Normal   Patient tolerance of procedure:  Tolerated well, no immediate complications    MEDICATIONS RECEIVED THIS VISIT: Medications - No data to display  PERTINENT CLINICAL COURSE NOTES/UPDATES:   Initial Impression / Assessment and Plan / Urgent Care Course:  Pertinent labs & imaging results that were available during my care of the patient were personally reviewed by me and considered in my medical decision making (see lab/imaging section of note for values and interpretations).  Margaret Bryan is a 59 y.o. female who presents to [facility] today with complaints of Otalgia  Patient is well appearing overall in clinic today.  She does not appear to be in any acute distress. Presenting symptoms (see HPI) and exam as documented above. Patient with significant pain in her RIGHT ear. She has a draining lesion behind the ear and to her scalp. Exam revealed EAC occluded by cerumen. Ear disimpacted as per above procedure note revealing concerns for concurrent otitis externa and media.Pain extends down into next. Lesion behind ear represents a simple staphylococcal infection. Will proceed with treatment as follows:   Advised to keep ear and lesions behind ear/scalp clean and dry.    Will cover skin lesions with topical mupirocin 1-2 times daily. Patient to monitor for signs and symptoms of infection, which would include increased redness, swelling, streaking, drainage, pain, and the development of a fever.     Will send in prescriptions for a 5 day course of cortisporin otic gtts (otitis externa(, in addition to a 10 day course of amoxicillin-clavulanate (otitis media).   May use Tylenol and/or Ibuprofen as needed for pain/fever.  Discussed follow up with primary care physician in 1 week for re-evaluation. I have reviewed the follow up and strict return precautions for any new or worsening symptoms. Patient is aware of symptoms that would be deemed urgent/emergent, and would thus require further evaluation either here or in the emergency department. At the time of discharge, she verbalized understanding and consent with the discharge plan as it was reviewed with her. All questions were fielded by provider and/or clinic staff prior to patient discharge.    Final Clinical Impressions / Urgent Care Diagnoses:   Final diagnoses:  Acute infection of right ear  Localized bacterial skin infection  Impacted cerumen of right ear    New Prescriptions:  [place] Controlled Substance Registry consulted? Not Applicable  Meds ordered this encounter  Medications  . amoxicillin-clavulanate (AUGMENTIN) 875-125 MG tablet    Sig: Take 1  tablet by mouth 2 (two) times daily for 10 days.    Dispense:  20 tablet    Refill:  0  . neomycin-polymyxin-hydrocortisone (CORTISPORIN) 3.5-10000-1 OTIC suspension    Sig: Place 4 drops into the right ear 3 (three) times daily. X 5 days    Dispense:  10 mL    Refill:  0    Recommended Follow up Care:  Patient encouraged to follow up with the following provider within the specified time frame, or sooner as dictated by the severity of her symptoms. As always, she was instructed that for any urgent/emergent care needs, she should seek care either here or in the emergency department for more immediate evaluation.  Follow-up Information    Feldpausch, [name], MD In 1 week.   Specialty: Family Medicine Why: General reassessment of symptoms if not improving Contact information: [place] [id number] [phone]         NOTE: This note was prepared using Dragon dictation software along with smaller phrase technology. Despite my best ability to proofread, there is the potential that transcriptional errors may still occur from this process, and are completely unintentional.    Karen Kitchens, NP 12/04/19 1017

## 2019-12-17 ENCOUNTER — Ambulatory Visit: Payer: PRIVATE HEALTH INSURANCE | Attending: Internal Medicine

## 2019-12-17 DIAGNOSIS — Z23 Encounter for immunization: Secondary | ICD-10-CM

## 2019-12-17 NOTE — Progress Notes (Signed)
   Covid-19 Vaccination Clinic  Name:  Margaret Bryan    MRN: [id number] DOB: 08-20-1961  12/17/2019  [name] was observed post Covid-19 immunization for 15 minutes without incident. She was provided with Vaccine Information Sheet and instruction to access the V-Safe system.   [name] was instructed to call 911 with any severe reactions post vaccine: Marland Kitchen Difficulty breathing  . Swelling of face and throat  . A fast heartbeat  . A bad rash all over body  . Dizziness and weakness   Immunizations Administered    Name Date Dose VIS Date Route   Pfizer COVID-19 Vaccine 12/17/2019 10:17 AM 0.3 mL 08/22/2019 Intramuscular   Manufacturer: [place]   Lot: [id number]   [place]: [id number]

## 2020-01-07 ENCOUNTER — Observation Stay
Admission: EM | Admit: 2020-01-07 | Discharge: 2020-01-08 | Disposition: A | Discharge status: Home / Self Care | Payer: PRIVATE HEALTH INSURANCE | Attending: Internal Medicine | Admitting: Internal Medicine

## 2020-01-07 ENCOUNTER — Emergency Department: Payer: PRIVATE HEALTH INSURANCE

## 2020-01-07 ENCOUNTER — Other Ambulatory Visit: Payer: Self-pay

## 2020-01-07 DIAGNOSIS — G43909 Migraine, unspecified, not intractable, without status migrainosus: Secondary | ICD-10-CM

## 2020-01-07 DIAGNOSIS — Z79899 Other long term (current) drug therapy: Secondary | ICD-10-CM | POA: Insufficient documentation

## 2020-01-07 DIAGNOSIS — R319 Hematuria, unspecified: Secondary | ICD-10-CM | POA: Diagnosis not present

## 2020-01-07 DIAGNOSIS — K861 Other chronic pancreatitis: Secondary | ICD-10-CM | POA: Insufficient documentation

## 2020-01-07 DIAGNOSIS — Z885 Allergy status to narcotic agent status: Secondary | ICD-10-CM | POA: Insufficient documentation

## 2020-01-07 DIAGNOSIS — Z8719 Personal history of other diseases of the digestive system: Secondary | ICD-10-CM | POA: Diagnosis not present

## 2020-01-07 DIAGNOSIS — R0789 Other chest pain: Principal | ICD-10-CM | POA: Insufficient documentation

## 2020-01-07 DIAGNOSIS — E785 Hyperlipidemia, unspecified: Secondary | ICD-10-CM | POA: Insufficient documentation

## 2020-01-07 DIAGNOSIS — K219 Gastro-esophageal reflux disease without esophagitis: Secondary | ICD-10-CM | POA: Diagnosis not present

## 2020-01-07 DIAGNOSIS — Z8249 Family history of ischemic heart disease and other diseases of the circulatory system: Secondary | ICD-10-CM | POA: Insufficient documentation

## 2020-01-07 DIAGNOSIS — Z20822 Contact with and (suspected) exposure to covid-19: Secondary | ICD-10-CM | POA: Insufficient documentation

## 2020-01-07 DIAGNOSIS — R0782 Intercostal pain: Secondary | ICD-10-CM | POA: Diagnosis not present

## 2020-01-07 DIAGNOSIS — R300 Dysuria: Secondary | ICD-10-CM | POA: Diagnosis not present

## 2020-01-07 DIAGNOSIS — I700 Atherosclerosis of aorta: Secondary | ICD-10-CM | POA: Diagnosis not present

## 2020-01-07 DIAGNOSIS — Z888 Allergy status to other drugs, medicaments and biological substances status: Secondary | ICD-10-CM | POA: Diagnosis not present

## 2020-01-07 DIAGNOSIS — R079 Chest pain, unspecified: Secondary | ICD-10-CM | POA: Diagnosis present

## 2020-01-07 LAB — RESPIRATORY PANEL BY RT PCR (FLU A&B, COVID)
Influenza A by PCR: NEGATIVE
Influenza B by PCR: NEGATIVE
SARS Coronavirus 2 by RT PCR: NEGATIVE

## 2020-01-07 LAB — BASIC METABOLIC PANEL
Anion gap: 6 (ref 5–15)
BUN: 17 mg/dL (ref 6–20)
CO2: 25 mmol/L (ref 22–32)
Calcium: 8.7 mg/dL — ABNORMAL LOW (ref 8.9–10.3)
Chloride: 109 mmol/L (ref 98–111)
Creatinine, Ser: 0.63 mg/dL (ref 0.44–1.00)
GFR calc Af Amer: >60 mL/min (ref >60)
GFR calc non Af Amer: >60 mL/min (ref >60)
Glucose, Bld: 110 mg/dL — ABNORMAL HIGH (ref 70–99)
Potassium: 3.7 mmol/L (ref 3.5–5.1)
Sodium: 140 mmol/L (ref 135–145)

## 2020-01-07 LAB — CBC
HCT: 43.3 % (ref 36.0–46.0)
Hemoglobin: 14.1 g/dL (ref 12.0–15.0)
MCH: 31.1 pg (ref 26.0–34.0)
MCHC: 32.6 g/dL (ref 30.0–36.0)
MCV: 95.6 fL (ref 80.0–100.0)
Platelets: 246 10*3/uL (ref 150–400)
RBC: 4.53 MIL/uL (ref 3.87–5.11)
RDW: 13.2 % (ref 11.5–15.5)
WBC: 5.2 10*3/uL (ref 4.0–10.5)
nRBC: 0 % (ref 0.0–0.2)

## 2020-01-07 LAB — LIPASE, BLOOD: Lipase: 244 U/L — ABNORMAL HIGH (ref 11–51)

## 2020-01-07 LAB — TROPONIN I (HIGH SENSITIVITY)
Troponin I (High Sensitivity): <2 ng/L (ref <18)
Troponin I (High Sensitivity): 5 ng/L (ref <18)

## 2020-01-07 MED ORDER — NITROGLYCERIN 0.4 MG SL SUBL
SUBLINGUAL_TABLET | SUBLINGUAL | Status: AC
  Filled 2020-01-07: qty 3

## 2020-01-07 MED ORDER — NITROGLYCERIN 0.4 MG SL SUBL
0.4000 mg | SUBLINGUAL_TABLET | Freq: Once | SUBLINGUAL | Status: AC
  Administered 2020-01-07: 0.4 mg via SUBLINGUAL
  Filled 2020-01-07: qty 1

## 2020-01-07 MED ORDER — HEPARIN SODIUM (PORCINE) 5000 UNIT/ML IJ SOLN
5000.0000 [IU] | Freq: Three times a day (TID) | INTRAMUSCULAR | Status: DC
  Administered 2020-01-07 – 2020-01-08 (×2): 5000 [IU] via SUBCUTANEOUS
  Filled 2020-01-07 (×2): qty 1

## 2020-01-07 MED ORDER — SODIUM CHLORIDE 0.9 % IV SOLN: INTRAVENOUS | Status: DC

## 2020-01-07 MED ORDER — NITROGLYCERIN 0.4 MG SL SUBL
0.4000 mg | SUBLINGUAL_TABLET | SUBLINGUAL | Status: DC | PRN
  Administered 2020-01-07: 0.4 mg via SUBLINGUAL

## 2020-01-07 MED ORDER — SODIUM CHLORIDE 0.45 % IV SOLN: INTRAVENOUS | Status: DC

## 2020-01-07 MED ORDER — SODIUM CHLORIDE 0.9% FLUSH: 3.0000 mL | Freq: Once | INTRAVENOUS | Status: DC

## 2020-01-07 MED ORDER — PANTOPRAZOLE SODIUM 40 MG PO TBEC: 40.0000 mg | DELAYED_RELEASE_TABLET | Freq: Every day | ORAL | Status: DC

## 2020-01-07 MED ORDER — MECLIZINE HCL 12.5 MG PO TABS
12.5000 mg | ORAL_TABLET | Freq: Three times a day (TID) | ORAL | Status: DC
  Administered 2020-01-07: 12.5 mg via ORAL
  Filled 2020-01-07 (×4): qty 1

## 2020-01-07 MED ORDER — ONDANSETRON HCL 4 MG/2ML IJ SOLN: 4.0000 mg | Freq: Four times a day (QID) | INTRAMUSCULAR | Status: DC | PRN

## 2020-01-07 MED ORDER — IOHEXOL 350 MG/ML SOLN
75.0000 mL | Freq: Once | INTRAVENOUS | Status: AC | PRN
  Administered 2020-01-07: 14:00:00 75 mL via INTRAVENOUS

## 2020-01-07 NOTE — ED Notes (Signed)
Called to reevaluate patient by husband.  Patient c/o feeling lightheaded and feeling like she is going to pass out.  Patient AAOx3.  Skin warm and dry. NAD.  VS wnl.  Reassurance given.

## 2020-01-07 NOTE — ED Notes (Signed)
Pt ambulated to toilet with steady gait

## 2020-01-07 NOTE — ED Triage Notes (Signed)
Pt comes via POV from work with c/o left sided chest  pain and radiation to left arm. Pt states this started some yesterday. Pt states she was at work when this episode started.   Pt states EMs and Fire were on scene and EMS gave her 4 baby aspirin.   Pt states some nausea. Pt denies any vomiting or diarrhea. Pt denies an back pain. Pt states left sided neck pain as well.

## 2020-01-07 NOTE — ED Provider Notes (Signed)
[facility] Emergency Department Provider Note   ____________________________________________    I have reviewed the triage vital signs and the nursing notes.   HISTORY  Chief Complaint Chest Pain     HPI Margaret Bryan is a 59 y.o. female who presents with complaints of chest pain.  Patient reports this started yesterday is a mild central chest discomfort.  This morning it seemed slightly more significant with radiation to her left arm intermittently.  Occasionally she feels a strange sensation in her jaw.  She does have a family history of heart disease, both her mother and father had heart attacks.  She does not smoke.  She does have mildly elevated cholesterol and blood pressure.  No shortness of breath or pleurisy.  Nothing seems to make it better or worse.  Does report a history of intermittent pancreatitis in the past but this does not feel similar  Past Medical History:  Diagnosis Date  . GERD (gastroesophageal reflux disease)   . Heart murmur   . Migraine   . Pancreas divisum     Patient Active Problem List   Diagnosis Date Noted  . H/O: hysterectomy 03/22/2018    Past Surgical History:  Procedure Laterality Date  . ABDOMINAL HYSTERECTOMY    . NASAL SINUS SURGERY      Prior to Admission medications   Medication Sig Start Date End Date Taking? Authorizing Provider  eletriptan (RELPAX) 20 MG tablet Take 20 mg by mouth as needed for migraine or headache. May repeat in 2 hours if headache persists or recurs.    [provider]  esomeprazole (NEXIUM) 40 MG capsule Take 1 capsule (40 mg total) by mouth daily. 07/01/18   Melynda Ripple, MD  fexofenadine (ALLEGRA) 180 MG tablet Take 1 tablet by mouth daily.    [provider]  fluticasone (FLONASE) 50 MCG/ACT nasal spray Place 2 sprays into the nose daily.    [provider]  JUBLIA 10 % SOLN Apply 1 application topically as needed. 11/13/19   [provider]  Magnesium 250 MG TABS Take by mouth.    [provider]  meclizine (ANTIVERT) 12.5 MG tablet Take by mouth. 07/19/16   [provider]  Multiple Vitamins-Minerals (ONE-A-DAY 50 PLUS PO) Take by mouth.    [provider]  neomycin-polymyxin-hydrocortisone (CORTISPORIN) 3.5-10000-1 OTIC suspension Place 4 drops into the right ear 3 (three) times daily. X 5 days 12/02/19   Karen Kitchens, NP  ondansetron (ZOFRAN-ODT) 8 MG disintegrating tablet Take 1 tablet (8 mg total) by mouth every 8 (eight) hours as needed for nausea or vomiting. 07/01/18   Melynda Ripple, MD  topiramate (TOPAMAX) 100 MG tablet Take 100 mg by mouth 2 (two) times daily.    [provider]     Allergies Imitrex [sumatriptan] and Percocet [oxycodone-acetaminophen]  Family History  Problem Relation Age of Onset  . Heart failure Mother   . Hypertension Mother   . Heart failure Father   . Hypertension Father   . Breast cancer Maternal Aunt 35  . Breast cancer Maternal Grandmother 46  . Breast cancer Cousin 20    Social History Social History   Tobacco Use  . Smoking status: Never Smoker  . Smokeless tobacco: Never Used  Substance Use Topics  . Alcohol use: No  . Drug use: Never    Review of Systems  Constitutional: No fever/chills Eyes: No visual changes.  ENT: No sore throat. Cardiovascular: As above Respiratory: Denies shortness  of breath. Gastrointestinal: No abdominal pain.  No nausea, no vomiting.   Genitourinary: Negative for dysuria. Musculoskeletal: Negative for back pain. Skin: Negative for rash. Neurological: Negative for headaches   ____________________________________________   PHYSICAL EXAM:  VITAL SIGNS: ED Triage Vitals [01/07/20 0943]  Enc Vitals Group     BP (!) 152/77     Pulse Rate 79     Resp 18     Temp 98.1 F (36.7 C)     Temp src      SpO2 100 %     Weight 88.5 kg (195 lb)     Height 1.727 m (5\' 8" )     Head Circumference       Peak Flow      Pain Score 5     Pain Loc      Pain Edu?      Excl. in Uncertain?     Constitutional: Alert and oriented.   Nose: No congestion/rhinnorhea. Mouth/Throat: Mucous membranes are moist.    Cardiovascular: Normal rate, regular rhythm. Grossly normal heart sounds.  Good peripheral circulation. Respiratory: Normal respiratory effort.  No retractions. Lungs CTAB. Gastrointestinal: Soft and nontender. No distention.  No CVA tenderness.  Musculoskeletal: No lower extremity tenderness nor edema.  Warm and well perfused Neurologic:  Normal speech and language. No gross focal neurologic deficits are appreciated.  Skin:  Skin is warm, dry and intact. No rash noted. Psychiatric: Mood and affect are normal. Speech and behavior are normal.  ____________________________________________   LABS (all labs ordered are listed, but only abnormal results are displayed)  Labs Reviewed  BASIC METABOLIC PANEL - Abnormal; Notable for the following components:      Result Value   Glucose, Bld 110 (*)    Calcium 8.7 (*)    All other components within normal limits  LIPASE, BLOOD - Abnormal; Notable for the following components:   Lipase 244 (*)    All other components within normal limits  CBC  TROPONIN I (HIGH SENSITIVITY)  TROPONIN I (HIGH SENSITIVITY)   ____________________________________________  EKG  ED ECG REPORT I, Lavonia Drafts, the attending physician, personally viewed and interpreted this ECG.  Date: 01/07/2020  Rhythm: normal sinus rhythm QRS Axis: normal Intervals: normal ST/T Wave abnormalities: Nonspecific changes Narrative Interpretation: no evidence of acute ischemia  ____________________________________________  RADIOLOGY  Chest x-ray unremarkable, reviewed by me, no pneumothorax or pneumonia CT angiography negative for PE ____________________________________________   PROCEDURES  Procedure(s) performed: No  Procedures   Critical Care performed:  No ____________________________________________   INITIAL IMPRESSION / ASSESSMENT AND PLAN / ED COURSE  Pertinent labs & imaging results that were available during my care of the patient were reviewed by me and considered in my medical decision making (see chart for details).  Patient presents with chest pain as described above.  Differential includes ACS, angina, PE, less likely aortic dissection.  Description of pain is somewhat concerning however initial troponin undetectable.  She does have a strong family history for heart disease.  Although initial troponin reassuring, will recheck troponin, add on lipase given her description of somewhat similar pain in the past and obtain CT angiography.  Will give nitroglycerin, did receive aspirin already.  CT angiography is negative for PE or abnormality.  Second troponin has increased slightly to 5.  Not entirely clear the significance of this given that her pain did start yesterday.  However given her elevated heart score did recommend admission the patient agreed.  She does have a mildly elevated  lipase however she reiterates this does not feel like her pancreatitis which she has had several times in the past.  Admitted to the hospitalist service      ____________________________________________   FINAL CLINICAL IMPRESSION(S) / ED DIAGNOSES  Final diagnoses:  Chest pain, unspecified type        Note:  This document was prepared using Dragon voice recognition software and may include unintentional dictation errors.   Lavonia Drafts, MD 01/07/20 1438

## 2020-01-07 NOTE — H&P (Addendum)
History and Physical    Margaret Bryan [id number] DOB: 06-Sep-1961 DOA: 01/07/2020  PCP: [name], MD  Patient coming from: home   Chief Complaint: chest pain  HPI: Margaret Bryan is a 59 y.o. female with medical history significant of migraine , GERD, hypercholesterolemia, pancreatitis, presents complaining of left-sided chest pain that occurred last night while at rest and continued till today.  This morning while she was at work her chest pain radiated to her left jaw and left arm with some tingling of her fingers.  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  When EMS arrived her blood pressure was down to 179/91. She was given 4 baby aspirin by EMS. She reports her chest pain subsided when given nitro.  ED Course: In ED was given nitro.  Prone's are negative.  Covid PCR negative.  Lipase elevated to 244.  CT of the chest was negative for PE and had aortic atherosclerosis  Review of Systems: All systems reviewed and otherwise negative.    Past Medical History:  Diagnosis Date  . GERD (gastroesophageal reflux disease)   . Heart murmur   . Migraine   . Pancreas divisum     Past Surgical History:  Procedure Laterality Date  . ABDOMINAL HYSTERECTOMY    . NASAL SINUS SURGERY       reports that she has never smoked. She has never used smokeless tobacco. She reports that she does not drink alcohol or use drugs.  Allergies  Allergen Reactions  . Imitrex [Sumatriptan]   . Percocet [Oxycodone-Acetaminophen] Other (See Comments)    Hallucinations, rapid heartbeat    Family History  Problem Relation Age of Onset  . Heart failure Mother   . Hypertension Mother   . Heart failure Father   . Hypertension Father   . Breast cancer Maternal Aunt 35  . Breast cancer Maternal Grandmother 39  . Breast cancer Cousin 20     Prior to Admission medications   Medication Sig  Start Date End Date Taking? Authorizing Provider  eletriptan (RELPAX) 40 MG tablet Take 40 mg by mouth as needed for migraine or headache. May repeat in 2 hours if headache persists or recurs.     [provider]  esomeprazole (NEXIUM) 40 MG capsule Take 1 capsule (40 mg total) by mouth daily. 07/01/18   Melynda Ripple, MD  fexofenadine (ALLEGRA) 180 MG tablet Take 1 tablet by mouth daily.    [provider]  fluticasone (FLONASE) 50 MCG/ACT nasal spray Place 2 sprays into the nose daily.    [provider]  JUBLIA 10 % SOLN Apply 1 application topically as needed. 11/13/19   [provider]  Magnesium 250 MG TABS Take by mouth.    [provider]  Magnesium Oxide 250 MG TABS Take by mouth.    [provider]  meclizine (ANTIVERT) 12.5 MG tablet Take by mouth. 07/19/16   [provider]  Multiple Vitamins-Minerals (ONE-A-DAY 50 PLUS PO) Take by mouth.    [provider]  ondansetron (ZOFRAN) 8 MG tablet Take 8 mg by mouth every 8 (eight) hours as needed. 09/11/19   [provider]  PAPAYA ENZYME PO Take by mouth.    [provider]  topiramate (TOPAMAX) 100 MG tablet Take 100 mg by mouth 2 (two) times daily.    [provider]    Physical Exam: Vitals:  01/07/20 0943 01/07/20 1054 01/07/20 1200 01/07/20 1230  BP: (!) 152/77 (!) 163/77 (!) 155/83 (!) 174/87  Pulse: 79 77 69 77  Resp: 18 18 13 15   Temp: 98.1 F (36.7 C)     SpO2: 100% 100% 100% 100%  Weight: 88.5 kg     Height: 5\' 8"  (1.727 m)       Constitutional: NAD, calm, comfortable Vitals:   01/07/20 0943 01/07/20 1054 01/07/20 1200 01/07/20 1230  BP: (!) 152/77 (!) 163/77 (!) 155/83 (!) 174/87  Pulse: 79 77 69 77  Resp: 18 18 13 15   Temp: 98.1 F (36.7 C)     SpO2: 100% 100% 100% 100%  Weight: 88.5 kg     Height: 5\' 8"  (1.727 m)      Eyes: PERRL, lids and conjunctivae normal ENMT: Mucous membranes are moist.Normal  dentition.  Neck: normal, supple, no masses, no thyromegaly Respiratory: clear to auscultation bilaterally, no wheezing, no crackles. Normal respiratory effort. No accessory muscle use.  Cardiovascular: Regular rate and rhythm, no murmurs / rubs / gallops. Abdomen: no tenderness, no masses palpated.  Bowel sounds positive.  Musculoskeletal: no clubbing / cyanosis.  + mild b/l edema  extremities. Good ROM, no contractures. Normal muscle tone.  Skin: warm, dry Neurologic: CN 2-12 grossly intact. aaxox3 Psychiatric: Normal judgment and insight. Normal mood.    Labs on Admission: I have personally reviewed following labs and imaging studies  CBC: Recent Labs  Lab 01/07/20 0945  WBC 5.2  HGB 14.1  HCT 43.3  MCV 95.6  PLT 0000000   Basic Metabolic Panel: Recent Labs  Lab 01/07/20 0945  NA 140  K 3.7  CL 109  CO2 25  GLUCOSE 110*  BUN 17  CREATININE 0.63  CALCIUM 8.7*   GFR: Estimated Creatinine Clearance: 90.3 mL/min (by C-G formula based on SCr of 0.63 mg/dL). Liver Function Tests: No results for input(s): AST, ALT, ALKPHOS, BILITOT, PROT, ALBUMIN in the last 168 hours. Recent Labs  Lab 01/07/20 0945  LIPASE 244*   No results for input(s): AMMONIA in the last 168 hours. Coagulation Profile: No results for input(s): INR, PROTIME in the last 168 hours. Cardiac Enzymes: No results for input(s): CKTOTAL, CKMB, CKMBINDEX, TROPONINI in the last 168 hours. BNP (last 3 results) No results for input(s): PROBNP in the last 8760 hours. HbA1C: No results for input(s): HGBA1C in the last 72 hours. CBG: No results for input(s): GLUCAP in the last 168 hours. Lipid Profile: No results for input(s): CHOL, HDL, LDLCALC, TRIG, CHOLHDL, LDLDIRECT in the last 72 hours. Thyroid Function Tests: No results for input(s): TSH, T4TOTAL, FREET4, T3FREE, THYROIDAB in the last 72 hours. Anemia Panel: No results for input(s): VITAMINB12, FOLATE, FERRITIN, TIBC, IRON, RETICCTPCT in the last 72  hours. Urine analysis:    Component Value Date/Time   COLORURINE YELLOW 07/08/2013 1746   APPEARANCEUR CLEAR 07/08/2013 1746   LABSPEC 1.010 07/08/2013 1746   PHURINE 7.5 07/08/2013 1746   GLUCOSEU NEGATIVE 07/08/2013 1746   HGBUR NEGATIVE 07/08/2013 1746   BILIRUBINUR NEGATIVE 07/08/2013 1746   KETONESUR NEGATIVE 07/08/2013 1746   PROTEINUR NEGATIVE 07/08/2013 1746   NITRITE NEGATIVE 07/08/2013 1746   LEUKOCYTESUR NEGATIVE 07/08/2013 1746    Radiological Exams on Admission: DG Chest 2 View  Result Date: 01/07/2020 CLINICAL DATA:  Chest pain radiating to left upper extremity EXAM: CHEST - 2 VIEW COMPARISON:  July 18, 2016. FINDINGS: Lungs are clear. Heart size and pulmonary vascularity are normal. No adenopathy. No bone lesions.  No pneumothorax. IMPRESSION: Lungs clear.  Cardiac silhouette within normal limits. Electronically Signed   By: Lowella Grip III M.D.   On: 01/07/2020 10:18   CT Angio Chest PE W and/or Wo Contrast  Result Date: 01/07/2020 CLINICAL DATA:  Chest pain EXAM: CT ANGIOGRAPHY CHEST WITH CONTRAST TECHNIQUE: Multidetector CT imaging of the chest was performed using the standard protocol during bolus administration of intravenous contrast. Multiplanar CT image reconstructions and MIPs were obtained to evaluate the vascular anatomy. CONTRAST:  72mL OMNIPAQUE IOHEXOL 350 MG/ML SOLN COMPARISON:  Chest x-ray 01/07/2020 FINDINGS: Cardiovascular: Satisfactory opacification of the pulmonary arteries to the segmental level. No evidence of pulmonary embolism. Normal heart size. No pericardial effusion. Thoracic aorta is normal in course and caliber. Minimal atherosclerotic calcification of the aortic arch. Mediastinum/Nodes: No enlarged mediastinal, hilar, or axillary lymph nodes. Thyroid gland, trachea, and esophagus demonstrate no significant findings. Lungs/Pleura: Lungs are clear. No pleural effusion or pneumothorax. Upper Abdomen: No acute abnormality. Musculoskeletal: No  chest wall abnormality. No acute or significant osseous findings. Review of the MIP images confirms the above findings. IMPRESSION: 1. Negative for pulmonary embolism or other acute intrathoracic abnormality. 2. Aortic atherosclerosis. (ICD10-I70.0). Electronically Signed   By: Davina Poke D.O.   On: 01/07/2020 14:14    EKG: Independently reviewed. NSR, no ischemic st/t changes  Assessment/Plan Active Problems:   Chest pain    1. Cp- with negative TP and non ischemic EKG . Serial Tp Has RF. Will obtain stress echo in am Admit to telemetry NPO at [facility] for stress test.  2. H/xo pancreatitis- lipase elevated, but chronically elevated. No pain on exam. Continue to monitor ivf  3. Migraine -on Topomax as outpt  4. GERD-continue home PPI   DVT prophylaxis: heparin sq  Code Status: full  Family Communication: husband at bedside updated  Disposition Plan: back to home on discharge  Consults called: none  Admission status: observation as pt requires <2 mn stays.    Nolberto Hanlon MD [facility] Pager [phone]   If 7PM-7AM, please contact night-coverage [email or web address] Password [facility]  01/07/2020, 3:34 PM

## 2020-01-08 ENCOUNTER — Inpatient Hospital Stay: Payer: PRIVATE HEALTH INSURANCE | Admitting: Oncology

## 2020-01-08 ENCOUNTER — Inpatient Hospital Stay: Payer: PRIVATE HEALTH INSURANCE

## 2020-01-08 DIAGNOSIS — K861 Other chronic pancreatitis: Secondary | ICD-10-CM

## 2020-01-08 DIAGNOSIS — E782 Mixed hyperlipidemia: Secondary | ICD-10-CM | POA: Diagnosis not present

## 2020-01-08 DIAGNOSIS — G43909 Migraine, unspecified, not intractable, without status migrainosus: Secondary | ICD-10-CM

## 2020-01-08 DIAGNOSIS — I700 Atherosclerosis of aorta: Secondary | ICD-10-CM

## 2020-01-08 DIAGNOSIS — R0789 Other chest pain: Secondary | ICD-10-CM | POA: Diagnosis not present

## 2020-01-08 DIAGNOSIS — G43809 Other migraine, not intractable, without status migrainosus: Secondary | ICD-10-CM

## 2020-01-08 DIAGNOSIS — Z8719 Personal history of other diseases of the digestive system: Secondary | ICD-10-CM | POA: Diagnosis not present

## 2020-01-08 DIAGNOSIS — R079 Chest pain, unspecified: Secondary | ICD-10-CM

## 2020-01-08 LAB — URINALYSIS, ROUTINE W REFLEX MICROSCOPIC
Bilirubin Urine: NEGATIVE
Glucose, UA: NEGATIVE mg/dL
Ketones, ur: NEGATIVE mg/dL
Nitrite: NEGATIVE
Protein, ur: 30 mg/dL — AB
Specific Gravity, Urine: 1.015 (ref 1.005–1.030)
pH: 6.5 (ref 5.0–8.0)

## 2020-01-08 LAB — URINALYSIS, MICROSCOPIC (REFLEX)
RBC / HPF: >50 RBC/hpf (ref 0–5)
WBC, UA: >50 WBC/hpf (ref 0–5)

## 2020-01-08 LAB — HIV ANTIBODY (ROUTINE TESTING W REFLEX): HIV Screen 4th Generation wRfx: NONREACTIVE

## 2020-01-08 MED ORDER — LEVOFLOXACIN 500 MG PO TABS: 500.0000 mg | ORAL_TABLET | Freq: Every day | ORAL | 0 refills | Status: DC

## 2020-01-08 MED ORDER — LEVOFLOXACIN 500 MG PO TABS
500.0000 mg | ORAL_TABLET | Freq: Every day | ORAL | Status: DC
  Administered 2020-01-08: 500 mg via ORAL
  Filled 2020-01-08: qty 1

## 2020-01-08 MED ORDER — NITROGLYCERIN 0.4 MG SL SUBL: 0.4000 mg | SUBLINGUAL_TABLET | SUBLINGUAL | 0 refills | Status: AC | PRN

## 2020-01-08 NOTE — Consult Note (Signed)
Cardiology Consultation:   Patient ID: Margaret Bryan MRN: [id number]; DOB: Feb 09, 1961  Admit date: 01/07/2020 Date of Consult: 01/08/2020  Primary Care Provider: [name], MD Primary Cardiologist: [facility] requesting consult, Dr. Kurtis Bushman Reason for consult: Chest pain, angina   Patient Profile:   Margaret Bryan is a 59 y.o. female with a hx of chronic pancreatitis, hyperlipidemia presenting with chest pain concerning for angina    History of Present Illness:   [name] reports a strong family history of coronary disease, father with bypass surgery in his 6s, mother with cardiac disease in her 6s  Patient is a non-smoker, no diabetes Total cholesterol in the low 200s  Tuesday evening developed left-sided chest pain radiating to her left jaw left arm Started overnight, woke up in the morning with recurrent symptoms, presented to the fire department EKG nonacute Cardiac enzymes negative Markedly hypertensive at EMTs/fire department, high blood pressure persisted for several hours  Fleeting chest pains overnight, now resolved     Past Medical History:  Diagnosis Date  . GERD (gastroesophageal reflux disease)   . Heart murmur   . Migraine   . Pancreas divisum     Past Surgical History:  Procedure Laterality Date  . ABDOMINAL HYSTERECTOMY    . NASAL SINUS SURGERY       Home Medications:  Prior to Admission medications   Medication Sig Start Date End Date Taking? Authorizing Provider  amoxicillin (AMOXIL) 500 MG capsule Take 500 mg by mouth 2 (two) times daily.   Yes [provider]  eletriptan (RELPAX) 40 MG tablet Take 40 mg by mouth as needed for migraine or headache. May repeat in 2 hours if headache persists or recurs.    Yes [provider]  esomeprazole (NEXIUM) 40 MG capsule Take 1 capsule (40 mg total) by mouth daily. 07/01/18  Yes Melynda Ripple, MD  fexofenadine (ALLEGRA) 180 MG tablet Take 180 mg  by mouth daily.    Yes [provider]  fluticasone (FLONASE) 50 MCG/ACT nasal spray Place 2 sprays into both nostrils daily as needed for allergies or rhinitis.    Yes [provider]  JUBLIA 10 % SOLN Apply 1 application topically as needed (recurring nail fungus).  11/13/19  Yes [provider]  Magnesium Oxide 250 MG TABS Take 250 mg by mouth daily.    Yes [provider]  Multiple Vitamins-Minerals (ONE-A-DAY 50 PLUS PO) Take 1 tablet by mouth daily.    Yes [provider]  ondansetron (ZOFRAN-ODT) 8 MG disintegrating tablet Take 8 mg by mouth every 8 (eight) hours as needed for nausea or vomiting.   Yes [provider]  topiramate (TOPAMAX) 100 MG tablet Take 100 mg by mouth 2 (two) times daily.   Yes [provider]    Inpatient Medications: Scheduled Meds: . heparin  5,000 Units Subcutaneous Q8H  . meclizine  12.5 mg Oral TID  . pantoprazole  40 mg Oral Daily  . sodium chloride flush  3 mL Intravenous Once   Continuous Infusions:  PRN Meds: nitroGLYCERIN, ondansetron (ZOFRAN) IV  Allergies:    Allergies  Allergen Reactions  . Imitrex [Sumatriptan]   . Percocet [Oxycodone-Acetaminophen] Other (See Comments)    Hallucinations, rapid heartbeat    Social History:   Social History   Socioeconomic History  . Marital status: Married    Spouse name: Not on file  . Number of children: Not on file  . Years of education: Not on file  .  Highest education level: Not on file  Occupational History  . Not on file  Tobacco Use  . Smoking status: Never Smoker  . Smokeless tobacco: Never Used  Substance and Sexual Activity  . Alcohol use: No  . Drug use: Never  . Sexual activity: Yes  Other Topics Concern  . Not on file  Social History Narrative  . Not on file   Social Determinants of Health   Financial Resource Strain:   . Difficulty of Paying Living Expenses:   Food Insecurity:   . Worried About Ship broker in the Last Year:   . Arboriculturist in the Last Year:   Transportation Needs:   . Film/video editor (Medical):   Marland Kitchen Lack of Transportation (Non-Medical):   Physical Activity:   . Days of Exercise per Week:   . Minutes of Exercise per Session:   Stress:   . Feeling of Stress :   Social Connections:   . Frequency of Communication with Friends and Family:   . Frequency of Social Gatherings with Friends and Family:   . Attends Religious Services:   . Active Member of Clubs or Organizations:   . Attends Archivist Meetings:   Marland Kitchen Marital Status:   Intimate Partner Violence:   . Fear of Current or Ex-Partner:   . Emotionally Abused:   Marland Kitchen Physically Abused:   . Sexually Abused:     Family History:   * Family History  Problem Relation Age of Onset  . Heart failure Mother   . Hypertension Mother   . Heart failure Father   . Hypertension Father   . Breast cancer Maternal Aunt 35  . Breast cancer Maternal Grandmother 34  . Breast cancer Cousin 20     ROS:  Please see the history of present illness.  Review of Systems  Constitutional: Negative.   HENT: Negative.   Respiratory: Negative.   Cardiovascular: Positive for chest pain.  Gastrointestinal: Negative.   Musculoskeletal: Negative.   Neurological: Negative.   Psychiatric/Behavioral: Negative.   All other systems reviewed and are negative.   Physical Exam/Data:   Vitals:   01/07/20 1700 01/07/20 1823 01/08/20 0310 01/08/20 0744  BP: 137/67 (!) 169/83 113/71 132/79  Pulse: 79 68 62 71  Resp: 16 14 20 18   Temp:  98.3 F (36.8 C) 97.8 F (36.6 C) 98.3 F (36.8 C)  TempSrc:  Oral Oral Oral  SpO2: 99% 100% 99% 100%  Weight:      Height:        Intake/Output Summary (Last 24 hours) at 01/08/2020 1019 Last data filed at 01/08/2020 0920 Gross per 24 hour  Intake 854.27 ml  Output 950 ml  Net -95.73 ml   Last 3 Weights 01/07/2020 12/02/2019 06/05/2019  Weight (lbs) 195 lb 200 lb 190 lb  Weight  (kg) 88.451 kg 90.719 kg 86.183 kg     Body mass index is 29.65 kg/m.  General:  Well nourished, well developed, in no acute distress HEENT: normal Lymph: no adenopathy Neck: no JVD Endocrine:  No thryomegaly Vascular: No carotid bruits; FA pulses 2+ bilaterally without bruits  Cardiac:  normal S1, S2; RRR; no murmur  Lungs:  clear to auscultation bilaterally, no wheezing, rhonchi or rales  Abd: soft, nontender, no hepatomegaly  Ext: no edema Musculoskeletal:  No deformities, BUE and BLE strength normal and equal Skin: warm and dry  Neuro:  CNs 2-12 intact, no focal abnormalities noted Psych:  Normal affect  EKG:  The EKG was personally reviewed and demonstrates:   Normal sinus rhythm with no significant ST-T wave changes Telemetry:  Telemetry was personally reviewed and demonstrates: Normal sinus rhythm  Relevant CV Studies:   Laboratory Data:  High Sensitivity Troponin:   Recent Labs  Lab 01/07/20 0945 01/07/20 1252  TROPONINIHS <2 5     Chemistry Recent Labs  Lab 01/07/20 0945  NA 140  K 3.7  CL 109  CO2 25  GLUCOSE 110*  BUN 17  CREATININE 0.63  CALCIUM 8.7*  GFRNONAA >60  GFRAA >60  ANIONGAP 6    No results for input(s): PROT, ALBUMIN, AST, ALT, ALKPHOS, BILITOT in the last 168 hours. Hematology Recent Labs  Lab 01/07/20 0945  WBC 5.2  RBC 4.53  HGB 14.1  HCT 43.3  MCV 95.6  MCH 31.1  MCHC 32.6  RDW 13.2  PLT 246   BNPNo results for input(s): BNP, PROBNP in the last 168 hours.  DDimer No results for input(s): DDIMER in the last 168 hours.   Radiology/Studies:  DG Chest 2 View  Result Date: 01/07/2020 CLINICAL DATA:  Chest pain radiating to left upper extremity EXAM: CHEST - 2 VIEW COMPARISON:  July 18, 2016. FINDINGS: Lungs are clear. Heart size and pulmonary vascularity are normal. No adenopathy. No bone lesions. No pneumothorax. IMPRESSION: Lungs clear.  Cardiac silhouette within normal limits. Electronically Signed   By: Lowella Grip III M.D.   On: 01/07/2020 10:18   CT Angio Chest PE W and/or Wo Contrast  Result Date: 01/07/2020 CLINICAL DATA:  Chest pain EXAM: CT ANGIOGRAPHY CHEST WITH CONTRAST TECHNIQUE: Multidetector CT imaging of the chest was performed using the standard protocol during bolus administration of intravenous contrast. Multiplanar CT image reconstructions and MIPs were obtained to evaluate the vascular anatomy. CONTRAST:  74mL OMNIPAQUE IOHEXOL 350 MG/ML SOLN COMPARISON:  Chest x-ray 01/07/2020 FINDINGS: Cardiovascular: Satisfactory opacification of the pulmonary arteries to the segmental level. No evidence of pulmonary embolism. Normal heart size. No pericardial effusion. Thoracic aorta is normal in course and caliber. Minimal atherosclerotic calcification of the aortic arch. Mediastinum/Nodes: No enlarged mediastinal, hilar, or axillary lymph nodes. Thyroid gland, trachea, and esophagus demonstrate no significant findings. Lungs/Pleura: Lungs are clear. No pleural effusion or pneumothorax. Upper Abdomen: No acute abnormality. Musculoskeletal: No chest wall abnormality. No acute or significant osseous findings. Review of the MIP images confirms the above findings. IMPRESSION: 1. Negative for pulmonary embolism or other acute intrathoracic abnormality. 2. Aortic atherosclerosis. (ICD10-I70.0). Electronically Signed   By: Davina Poke D.O.   On: 01/07/2020 14:14     Assessment and Plan:   Chest pain, angina EKG nonacute, cardiac enzymes negative x2 Chest CTA no PE Minimal aortic atherosclerosis in the arch No significant coronary calcification noted -Long discussion concerning various treatment options Hospitalist has ordered a stress echo which we do not perform at [place] hoping to avoid radiation dose from my review -Discussed other options that can be done either inpatient or outpatient including Myoview, regular treadmill, cardiac catheterization as inpatient As outpatient we can  offer treadmill echo, cardiac CTA, cardiac catheterization, nuclear stress test --- Recommend she walk first the hallways and then decide, if no recurrent pain would be acceptable risk to do outpatient testing -Our contact information has been provided, she can let us know what testing modality she prefers We did discuss risk and benefit of each modality Aspirin statin, Unable to exclude spasm as a cause of her symptoms We will discharge on  nitro sublingual as needed, Imdur 15 to 30 mg as needed for recurrent symptoms  Pancreatitis Chronic issue, chronically elevated pancreatic enzymes We will defer to hospitalist  Hematuria Reports having some blood in her urine when she was on heparin infusion Recommend she discuss this with Dr. [name] to determine if referral to urology as needed  Hyperlipidemia Discussed with her, Would consider aspirin, statin, beta-blocker   Total encounter time more than 110 minutes  Greater than 50% was spent in counseling and coordination of care with the patient  For questions or updates, please contact [place] Please consult [email or web address] for contact info under     Signed, Ida Rogue, MD  01/08/2020 10:19 AM

## 2020-01-08 NOTE — Discharge Summary (Signed)
Margaret Bryan [id number] DOB: Feb 09, 1961 DOA: 01/07/2020  PCP: [name], MD  Admit date: 01/07/2020 Discharge date: 01/08/2020  Admitted From: Home Disposition: Home  Recommendations for Outpatient Follow-up:  1. Follow up with PCP in 1 week 2. Please obtain BMP/CBC in one week 3. Cardiology Dr Rockey Situ in 1-3 days for stress testing  Home Health:none    Discharge Condition:Stable CODE STATUS:full  Diet recommendation: Heart Healthy  Brief/Interim Summary: Margaret Bryan is a 59 y.o. female with medical history significant of migraine , GERD, hypercholesterolemia, pancreatitis, presents complaining of left-sided chest pain .  Her EKG was nonischemic.  Her troponins were negative.  Her Covid PCR was negative.  Her lipase was elevated to 44 only mildly elevated from her baseline which is chronically elevated.  She had a CT of the chest was negative for PE and had aortic atherosclerosis.  Cardiology was consulted. They recommended patient to follow-up as outpatient for stress testing.  They did recommend Imdur and/or nitro sublingual prn as we cannot exclude spasm as cause of her symptoms.  On the day of discharge she was complaining of just dysuria and hematuria.  UA was collected.  She was discharged with antibiotics and was told to follow-up with her primary care for further evaluation. Discharge Diagnoses:  Active Problems:   Chest pain    Discharge Instructions  Discharge Instructions    Call MD for:  temperature >100.4   Complete by: As directed    Diet - low sodium heart healthy   Complete by: As directed    Increase activity slowly   Complete by: As directed      Allergies as of 01/08/2020      Reactions   Imitrex [sumatriptan]    Percocet [oxycodone-acetaminophen] Other (See Comments)   Hallucinations, rapid heartbeat      Medication List    STOP taking these medications   amoxicillin 500 MG capsule Commonly known as: AMOXIL     TAKE these medications    eletriptan 40 MG tablet Commonly known as: RELPAX Take 40 mg by mouth as needed for migraine or headache. May repeat in 2 hours if headache persists or recurs.   esomeprazole 40 MG capsule Commonly known as: NEXIUM Take 1 capsule (40 mg total) by mouth daily.   fexofenadine 180 MG tablet Commonly known as: ALLEGRA Take 180 mg by mouth daily.   fluticasone 50 MCG/ACT nasal spray Commonly known as: FLONASE Place 2 sprays into both nostrils daily as needed for allergies or rhinitis.   Jublia 10 % Soln Generic drug: Efinaconazole Apply 1 application topically as needed (recurring nail fungus).   levofloxacin 500 MG tablet Commonly known as: LEVAQUIN Take 1 tablet (500 mg total) by mouth daily.   Magnesium Oxide 250 MG Tabs Take 250 mg by mouth daily.   nitroGLYCERIN 0.4 MG SL tablet Commonly known as: NITROSTAT Place 1 tablet (0.4 mg total) under the tongue every 5 (five) minutes as needed for chest pain.   ondansetron 8 MG disintegrating tablet Commonly known as: ZOFRAN-ODT Take 8 mg by mouth every 8 (eight) hours as needed for nausea or vomiting.   ONE-A-DAY 50 PLUS PO Take 1 tablet by mouth daily.   topiramate 100 MG tablet Commonly known as: TOPAMAX Take 100 mg by mouth 2 (two) times daily.      Follow-up Information    [name] [name], MD. Schedule an appointment as soon as possible for a visit on 01/15/2020.   Specialty: Family Medicine Contact information: [id number]  [address] [place] [address] [id number]        Minna Merritts, MD. Go on 01/15/2020.   Specialty: Cardiology Why: appointment at 11:30am Contact information: [address] [place] [address] [phone]          Allergies  Allergen Reactions  . Imitrex [Sumatriptan]   . Percocet [Oxycodone-Acetaminophen] Other (See Comments)    Hallucinations, rapid heartbeat    Consultations:   Cardiology  Procedures/Studies: DG Chest 2 View  Result Date:  01/07/2020 CLINICAL DATA:  Chest pain radiating to left upper extremity EXAM: CHEST - 2 VIEW COMPARISON:  July 18, 2016. FINDINGS: Lungs are clear. Heart size and pulmonary vascularity are normal. No adenopathy. No bone lesions. No pneumothorax. IMPRESSION: Lungs clear.  Cardiac silhouette within normal limits. Electronically Signed   By: Lowella Grip III M.D.   On: 01/07/2020 10:18   CT Angio Chest PE W and/or Wo Contrast  Result Date: 01/07/2020 CLINICAL DATA:  Chest pain EXAM: CT ANGIOGRAPHY CHEST WITH CONTRAST TECHNIQUE: Multidetector CT imaging of the chest was performed using the standard protocol during bolus administration of intravenous contrast. Multiplanar CT image reconstructions and MIPs were obtained to evaluate the vascular anatomy. CONTRAST:  83mL OMNIPAQUE IOHEXOL 350 MG/ML SOLN COMPARISON:  Chest x-ray 01/07/2020 FINDINGS: Cardiovascular: Satisfactory opacification of the pulmonary arteries to the segmental level. No evidence of pulmonary embolism. Normal heart size. No pericardial effusion. Thoracic aorta is normal in course and caliber. Minimal atherosclerotic calcification of the aortic arch. Mediastinum/Nodes: No enlarged mediastinal, hilar, or axillary lymph nodes. Thyroid gland, trachea, and esophagus demonstrate no significant findings. Lungs/Pleura: Lungs are clear. No pleural effusion or pneumothorax. Upper Abdomen: No acute abnormality. Musculoskeletal: No chest wall abnormality. No acute or significant osseous findings. Review of the MIP images confirms the above findings. IMPRESSION: 1. Negative for pulmonary embolism or other acute intrathoracic abnormality. 2. Aortic atherosclerosis. (ICD10-I70.0). Electronically Signed   By: Davina Poke D.O.   On: 01/07/2020 14:14       Subjective: Complaining of dysuria and mild hematuria no chest pain ambulated  Discharge Exam: Vitals:   01/08/20 0744 01/08/20 1149  BP: 132/79 132/76  Pulse: 71 76  Resp: 18 19  Temp:  98.3 F (36.8 C) 98.2 F (36.8 C)  SpO2: 100% 100%   Vitals:   01/07/20 1823 01/08/20 0310 01/08/20 0744 01/08/20 1149  BP: (!) 169/83 113/71 132/79 132/76  Pulse: 68 62 71 76  Resp: 14 20 18 19   Temp: 98.3 F (36.8 C) 97.8 F (36.6 C) 98.3 F (36.8 C) 98.2 F (36.8 C)  TempSrc: Oral Oral Oral   SpO2: 100% 99% 100% 100%  Weight:      Height:        General: Pt is alert, awake, not in acute distress Cardiovascular: RRR, S1/S2 +, no rubs, no gallops Respiratory: CTA bilaterally, no wheezing, no rhonchi Abdominal: Soft, NT, ND, bowel sounds + Extremities: no edema, no cyanosis    The results of significant diagnostics from this hospitalization (including imaging, microbiology, ancillary and laboratory) are listed below for reference.     Microbiology: Recent Results (from the past 240 hour(s))  Respiratory Panel by RT PCR (Flu A&B, Covid) - Nasopharyngeal Swab     Status: None   Collection Time: 01/07/20  2:46 PM   Specimen: Nasopharyngeal Swab  Result Value Ref Range Status   SARS Coronavirus 2 by RT PCR NEGATIVE NEGATIVE Final    Comment: (NOTE) SARS-CoV-2 target nucleic acids are  NOT DETECTED. The SARS-CoV-2 RNA is generally detectable in upper respiratoy specimens during the acute phase of infection. The lowest concentration of SARS-CoV-2 viral copies this assay can detect is 131 copies/mL. A negative result does not preclude SARS-Cov-2 infection and should not be used as the sole basis for treatment or other patient management decisions. A negative result may occur with  improper specimen collection/handling, submission of specimen other than nasopharyngeal swab, presence of viral mutation(s) within the areas targeted by this assay, and inadequate number of viral copies (<131 copies/mL). A negative result must be combined with clinical observations, patient history, and epidemiological information. The expected result is Negative. Fact Sheet for Patients:   PinkCheek.be Fact Sheet for Healthcare Providers:  GravelBags.it This test is not yet ap proved or cleared by the [place] FDA and  has been authorized for detection and/or diagnosis of SARS-CoV-2 by FDA under an Emergency Use Authorization (EUA). This EUA will remain  in effect (meaning this test can be used) for the duration of the COVID-19 declaration under Section 564(b)(1) of the Act, 21 U.S.C. section 360bbb-3(b)(1), unless the authorization is terminated or revoked sooner.    Influenza A by PCR NEGATIVE NEGATIVE Final   Influenza B by PCR NEGATIVE NEGATIVE Final    Comment: (NOTE) The Xpert Xpress SARS-CoV-2/FLU/RSV assay is intended as an aid in  the diagnosis of influenza from Nasopharyngeal swab specimens and  should not be used as a sole basis for treatment. Nasal washings and  aspirates are unacceptable for Xpert Xpress SARS-CoV-2/FLU/RSV  testing. Fact Sheet for Patients: PinkCheek.be Fact Sheet for Healthcare Providers: GravelBags.it This test is not yet approved or cleared by the [place] FDA and  has been authorized for detection and/or diagnosis of SARS-CoV-2 by  FDA under an Emergency Use Authorization (EUA). This EUA will remain  in effect (meaning this test can be used) for the duration of the  Covid-19 declaration under Section 564(b)(1) of the Act, 21  U.S.C. section 360bbb-3(b)(1), unless the authorization is  terminated or revoked. Performed at [facility], [address] [place], [place] [address]      Labs: BNP (last 3 results) No results for input(s): BNP in the last 8760 hours. Basic Metabolic Panel: Recent Labs  Lab 01/07/20 0945  NA 140  K 3.7  CL 109  CO2 25  GLUCOSE 110*  BUN 17  CREATININE 0.63  CALCIUM 8.7*   Liver Function Tests: No results for input(s): AST, ALT, ALKPHOS, BILITOT, PROT, ALBUMIN  in the last 168 hours. Recent Labs  Lab 01/07/20 0945  LIPASE 244*   No results for input(s): AMMONIA in the last 168 hours. CBC: Recent Labs  Lab 01/07/20 0945  WBC 5.2  HGB 14.1  HCT 43.3  MCV 95.6  PLT 246   Cardiac Enzymes: No results for input(s): CKTOTAL, CKMB, CKMBINDEX, TROPONINI in the last 168 hours. BNP: Invalid input(s): POCBNP CBG: No results for input(s): GLUCAP in the last 168 hours. D-Dimer No results for input(s): DDIMER in the last 72 hours. Hgb A1c No results for input(s): HGBA1C in the last 72 hours. Lipid Profile No results for input(s): CHOL, HDL, LDLCALC, TRIG, CHOLHDL, LDLDIRECT in the last 72 hours. Thyroid function studies No results for input(s): TSH, T4TOTAL, T3FREE, THYROIDAB in the last 72 hours.  Invalid input(s): FREET3 Anemia work up No results for input(s): VITAMINB12, FOLATE, FERRITIN, TIBC, IRON, RETICCTPCT in the last 72 hours. Urinalysis    Component Value Date/Time   COLORURINE YELLOW 01/08/2020  1135   APPEARANCEUR CLOUDY (A) 01/08/2020 [place] 07/08/2013 1746   LABSPEC 1.015 01/08/2020 1135   LABSPEC 1.010 07/08/2013 1746   PHURINE 6.5 01/08/2020 1135   GLUCOSEU NEGATIVE 01/08/2020 1135   GLUCOSEU NEGATIVE 07/08/2013 1746   HGBUR LARGE (A) 01/08/2020 [place] 01/08/2020 [place] 07/08/2013 1746   [place] 01/08/2020 1135   PROTEINUR 30 (A) 01/08/2020 1135   NITRITE NEGATIVE 01/08/2020 1135   LEUKOCYTESUR MODERATE (A) 01/08/2020 1135   LEUKOCYTESUR NEGATIVE 07/08/2013 1746   Sepsis Labs Invalid input(s): PROCALCITONIN,  WBC,  LACTICIDVEN Microbiology Recent Results (from the past 240 hour(s))  Respiratory Panel by RT PCR (Flu A&B, Covid) - Nasopharyngeal Swab     Status: None   Collection Time: 01/07/20  2:46 PM   Specimen: Nasopharyngeal Swab  Result Value Ref Range Status   SARS Coronavirus 2 by RT PCR NEGATIVE NEGATIVE Final    Comment:  (NOTE) SARS-CoV-2 target nucleic acids are NOT DETECTED. The SARS-CoV-2 RNA is generally detectable in upper respiratoy specimens during the acute phase of infection. The lowest concentration of SARS-CoV-2 viral copies this assay can detect is 131 copies/mL. A negative result does not preclude SARS-Cov-2 infection and should not be used as the sole basis for treatment or other patient management decisions. A negative result may occur with  improper specimen collection/handling, submission of specimen other than nasopharyngeal swab, presence of viral mutation(s) within the areas targeted by this assay, and inadequate number of viral copies (<131 copies/mL). A negative result must be combined with clinical observations, patient history, and epidemiological information. The expected result is Negative. Fact Sheet for Patients:  PinkCheek.be Fact Sheet for Healthcare Providers:  GravelBags.it This test is not yet ap proved or cleared by the [place] FDA and  has been authorized for detection and/or diagnosis of SARS-CoV-2 by FDA under an Emergency Use Authorization (EUA). This EUA will remain  in effect (meaning this test can be used) for the duration of the COVID-19 declaration under Section 564(b)(1) of the Act, 21 U.S.C. section 360bbb-3(b)(1), unless the authorization is terminated or revoked sooner.    Influenza A by PCR NEGATIVE NEGATIVE Final   Influenza B by PCR NEGATIVE NEGATIVE Final    Comment: (NOTE) The Xpert Xpress SARS-CoV-2/FLU/RSV assay is intended as an aid in  the diagnosis of influenza from Nasopharyngeal swab specimens and  should not be used as a sole basis for treatment. Nasal washings and  aspirates are unacceptable for Xpert Xpress SARS-CoV-2/FLU/RSV  testing. Fact Sheet for Patients: PinkCheek.be Fact Sheet for Healthcare  Providers: GravelBags.it This test is not yet approved or cleared by the [place] FDA and  has been authorized for detection and/or diagnosis of SARS-CoV-2 by  FDA under an Emergency Use Authorization (EUA). This EUA will remain  in effect (meaning this test can be used) for the duration of the  Covid-19 declaration under Section 564(b)(1) of the Act, 21  U.S.C. section 360bbb-3(b)(1), unless the authorization is  terminated or revoked. Performed at [facility], [address], [place], [place] [address]      Time coordinating discharge: Over 30 minutes  SIGNED:   Nolberto Hanlon, MD  [facility] 01/08/2020, 4:39 PM Pager   If 7PM-7AM, please contact night-coverage [email or web address] Password [email or web address]

## 2020-01-09 ENCOUNTER — Encounter: Payer: Self-pay | Admitting: *Deleted

## 2020-01-09 ENCOUNTER — Other Ambulatory Visit: Payer: Self-pay | Admitting: *Deleted

## 2020-01-09 ENCOUNTER — Telehealth: Payer: Self-pay | Admitting: Cardiovascular Disease

## 2020-01-09 DIAGNOSIS — R072 Precordial pain: Secondary | ICD-10-CM

## 2020-01-09 MED ORDER — METOPROLOL TARTRATE 100 MG PO TABS
ORAL_TABLET | ORAL | 0 refills | Status: DC
Start: 2020-01-09 — End: 2020-02-04

## 2020-01-09 NOTE — Telephone Encounter (Signed)
I called and spoke with the patient at the request of Dr. Rockey Situ. He notified me that he saw the patient yesterday as an inpatient and she was trying to decide on outpatient testing: Cardiac CT vs Stress echo.   I have discussed both test with the patient today. She states that she and her husband spoke last night and she would like to proceed with the Cardiac CTA.   I have reviewed the instructions below with her and advised a copy of these would be mailed to her.  The patient is aware she will be called to schedule.   She is also aware I will confirm which dose of metoprolol she will need and call this in to her pharmacy.  The patient advised she had no further questions at this time.   Confirmed the patient's inpatient vital signs from 01/08/20 showed her HR to be 76 bpm & BP 132/76. I will send in metoprolol tartrate 100 mg x 1 dose to be taken 2 hours prior to her Cardiac CT.   Your cardiac CT will be scheduled at one of the below locations:   [facility] [address] [place], [place] [address] [phone]  [place] [address] [place], [place] [address] [phone]  If scheduled at [facility], please arrive at the [facility] main entrance of [facility] 30 minutes prior to test start time. Proceed to the [facility] [facility] Radiology Department (first floor) to check-in and test prep.  If scheduled at [facility], please arrive 15 mins early for check-in and test prep.  Please follow these instructions carefully (unless otherwise directed):   On the Night Before the Test: . Be sure to Drink plenty of water. . Do not consume any caffeinated/decaffeinated beverages or chocolate 12 hours prior to your test. . Do not take any antihistamines 12 hours prior to your test.  On the Day of the Test: . Drink plenty of water. Do not drink any water within one hour of the  test. . Do not eat any food 4 hours prior to the test. . You may take your regular medications prior to the test.  . Take metoprolol (Lopressor) 100 mg two hours prior to test x 1 dose . FEMALES- please wear underwire-free bra if available       After the Test: . Drink plenty of water. . After receiving IV contrast, you may experience a mild flushed feeling. This is normal. . On occasion, you may experience a mild rash up to 24 hours after the test. This is not dangerous. If this occurs, you can take Benadryl 25 mg and increase your fluid intake. . If you experience trouble breathing, this can be serious. If it is severe call 911 IMMEDIATELY. If it is mild, please call our office.   Once we have confirmed authorization from your insurance company, we will call you to set up a date and time for your test.   For non-scheduling related questions, please contact the cardiac imaging nurse navigator should you have any questions/concerns: Marchia Bond, RN Navigator Cardiac Imaging [facility] [phone] office  For scheduling needs, including cancellations and rescheduling, please call [phone].

## 2020-01-15 ENCOUNTER — Other Ambulatory Visit: Payer: Self-pay

## 2020-01-15 ENCOUNTER — Ambulatory Visit (INDEPENDENT_AMBULATORY_CARE_PROVIDER_SITE_OTHER): Payer: PRIVATE HEALTH INSURANCE | Admitting: Family

## 2020-01-15 ENCOUNTER — Encounter: Payer: Self-pay | Admitting: Family

## 2020-01-15 ENCOUNTER — Telehealth: Payer: Self-pay

## 2020-01-15 VITALS — BP 138/82 | HR 72 | Ht 68.0 in | Wt 198.2 lb

## 2020-01-15 DIAGNOSIS — R030 Elevated blood-pressure reading, without diagnosis of hypertension: Secondary | ICD-10-CM

## 2020-01-15 DIAGNOSIS — R072 Precordial pain: Secondary | ICD-10-CM

## 2020-01-15 DIAGNOSIS — E785 Hyperlipidemia, unspecified: Secondary | ICD-10-CM | POA: Diagnosis not present

## 2020-01-15 DIAGNOSIS — I951 Orthostatic hypotension: Secondary | ICD-10-CM

## 2020-01-15 DIAGNOSIS — R0600 Dyspnea, unspecified: Secondary | ICD-10-CM | POA: Diagnosis not present

## 2020-01-15 DIAGNOSIS — R0609 Other forms of dyspnea: Secondary | ICD-10-CM
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
## 2020-01-15 NOTE — Telephone Encounter (Signed)
  Patient Consent for Virtual Visit         Margaret Bryan has provided verbal consent on 01/15/2020 for a virtual visit (video or telephone).   CONSENT FOR VIRTUAL VISIT FOR:  Margaret Bryan  By participating in this virtual visit I agree to the following:  I hereby voluntarily request, consent and authorize [facility] and its employed or contracted physicians, physician assistants, nurse practitioners or other licensed health care professionals (the Practitioner), to provide me with telemedicine health care services (the "Services") as deemed necessary by the treating Practitioner. I acknowledge and consent to receive the Services by the Practitioner via telemedicine. I understand that the telemedicine visit will involve communicating with the Practitioner through live audiovisual communication technology and the disclosure of certain medical information by electronic transmission. I acknowledge that I have been given the opportunity to request an in-person assessment or other available alternative prior to the telemedicine visit and am voluntarily participating in the telemedicine visit.  I understand that I have the right to withhold or withdraw my consent to the use of telemedicine in the course of my care at any time, without affecting my right to future care or treatment, and that the Practitioner or I may terminate the telemedicine visit at any time. I understand that I have the right to inspect all information obtained and/or recorded in the course of the telemedicine visit and may receive copies of available information for a reasonable fee.  I understand that some of the potential risks of receiving the Services via telemedicine include:  Marland Kitchen Delay or interruption in medical evaluation due to technological equipment failure or disruption; . Information transmitted may not be sufficient (e.g. poor resolution of images) to allow for appropriate medical decision making by the Practitioner; and/or   . In rare instances, security protocols could fail, causing a breach of personal health information.  Furthermore, I acknowledge that it is my responsibility to provide information about my medical history, conditions and care that is complete and accurate to the best of my ability. I acknowledge that Practitioner's advice, recommendations, and/or decision may be based on factors not within their control, such as incomplete or inaccurate data provided by me or distortions of diagnostic images or specimens that may result from electronic transmissions. I understand that the practice of medicine is not an exact science and that Practitioner makes no warranties or guarantees regarding treatment outcomes. I acknowledge that a copy of this consent can be made available to me via my patient portal ([facility]), or I can request a printed copy by calling the office of [facility].    I understand that my insurance will be billed for this visit.   I have read or had this consent read to me. . I understand the contents of this consent, which adequately explains the benefits and risks of the Services being provided via telemedicine.  . I have been provided ample opportunity to ask questions regarding this consent and the Services and have had my questions answered to my satisfaction. . I give my informed consent for the services to be provided through the use of telemedicine in my medical care

## 2020-01-15 NOTE — Patient Instructions (Addendum)
Medication Instructions:  No medication changes today.   We can consider low dose Imdur or low dose Calcium Channel Blocker to help with symptoms. Let us know if you would like to start.   Recommend checking your blood pressure once per day and keeping log in case we decide to start these medications so we know your baseline.   We will discuss cholesterol medication after cardiac CTA.   *If you need a refill on your cardiac medications before your next appointment, please call your pharmacy*  Lab Work: No lab work today.   Testing/Procedures: Your EKG today shows normal sinus rhythm with no changes concerning for blockages.   Your cardiac CTA is pending auth as of 01/12/20. Will continue to keep you updated. Hopefully we will have authorization soon and can schedule. If we find there is going to be significant delay, we can consider alternative testing. Hopefully will be able to schedule ASAP.  Follow-Up:  Follow up with Dr. Rockey Situ via [facility] on 02/04/20 at 8:40 AM  Other Instructions  Your cardiac CT will be scheduled at one of the below locations:   [facility] [address] [place], [place] [address] [phone]  [facility] [address] [place],  [address] [phone]  If scheduled at [facility], please arrive at the [facility] main entrance of [facility] 30 minutes prior to test start time. Proceed to the [facility] Radiology Department (first floor) to check-in and test prep.  If scheduled at [facility], please arrive 15 mins early for check-in and test prep.  Please follow these instructions carefully (unless otherwise directed):   On the Night Before the Test:  Be sure to Drink plenty of water.  Do not consume any caffeinated/decaffeinated beverages or chocolate 12 hours prior to your test.  Do not take any antihistamines 12  hours prior to your test.  On the Day of the Test:  Drink plenty of water. Do not drink any water within one hour of the test.  Do not eat any food 4 hours prior to the test.  You may take your regular medications prior to the test.   Take metoprolol (Lopressor) 100 mg two hours prior to test x 1 dose  FEMALES- please wear underwire-free bra if available       After the Test:  Drink plenty of water.  After receiving IV contrast, you may experience a mild flushed feeling. This is normal.  On occasion, you may experience a mild rash up to 24 hours after the test. This is not dangerous. If this occurs, you can take Benadryl 25 mg and increase your fluid intake.  If you experience trouble breathing, this can be serious. If it is severe call 911 IMMEDIATELY. If it is mild, please call our office.   Once we have confirmed authorization from your insurance company, we will call you to set up a date and time for your test.   For non-scheduling related questions, please contact the cardiac imaging nurse navigator should you have any questions/concerns: Marchia Bond, RN Navigator Cardiac Imaging [facility] [phone] office  For scheduling needs, including cancellations and rescheduling, please call [phone].

## 2020-01-16 ENCOUNTER — Inpatient Hospital Stay: Payer: PRIVATE HEALTH INSURANCE | Attending: Oncology | Admitting: Oncology

## 2020-01-16 ENCOUNTER — Inpatient Hospital Stay: Payer: PRIVATE HEALTH INSURANCE

## 2020-01-16 ENCOUNTER — Encounter: Payer: Self-pay | Admitting: Oncology

## 2020-01-16 VITALS — BP 133/83 | HR 69 | Temp 97.1°F | Resp 18 | Wt 200.0 lb

## 2020-01-16 DIAGNOSIS — I700 Atherosclerosis of aorta: Secondary | ICD-10-CM | POA: Insufficient documentation

## 2020-01-16 DIAGNOSIS — Z803 Family history of malignant neoplasm of breast: Secondary | ICD-10-CM | POA: Insufficient documentation

## 2020-01-16 DIAGNOSIS — R233 Spontaneous ecchymoses: Secondary | ICD-10-CM | POA: Diagnosis present

## 2020-01-16 DIAGNOSIS — Z79899 Other long term (current) drug therapy: Secondary | ICD-10-CM | POA: Diagnosis not present

## 2020-01-16 DIAGNOSIS — R238 Other skin changes: Secondary | ICD-10-CM

## 2020-01-16 LAB — PROTIME-INR
INR: 0.9 (ref 0.8–1.2)
Prothrombin Time: 12 seconds (ref 11.4–15.2)

## 2020-01-16 LAB — APTT: aPTT: 29 seconds (ref 24–36)

## 2020-01-16 LAB — PLATELET FUNCTION ASSAY: Collagen / Epinephrine: 164 seconds (ref 0–193)

## 2020-01-16 NOTE — Progress Notes (Signed)
New evaluation for easy bruising and bleeding for at least the past year.

## 2020-01-16 NOTE — Progress Notes (Signed)
Hematology/Oncology Consult note [facility] [phone] [phone]   Patient Care Team: [name], MD as PCP - General (Family Medicine) [name], MD as PCP - Cardiology (Cardiology)  REFERRING PROVIDER: [name], MD  CHIEF COMPLAINTS/REASON FOR VISIT:  Evaluation of easy bruising  HISTORY OF PRESENTING ILLNESS:   Margaret Bryan is a  59 y.o.  female with PMH listed below was seen in consultation at the request of  Feldpausch, [name], MD  for evaluation of easy bruising Patient reports that for the past 1 year, she has noticed multiple bruising spots on her bilateral upper extremities and lower extremities.  She was recently admitted from 01/07/2020-249 2001 due to chest pain patient had a CT of the chest which was negative for PE.  Patient has atherosclerosis.  Patient was seen by cardiology and she follows up with cardiology outpatient for stress test.  Patient has Imdur and/or nitro sublingual as needed. She says that she received pharmacological DVT prophylaxis injections, and she developed bruising around the injection sites to.  She denies any bruising on her trunk, back, or face. She had 1 episode of mild epistaxis few weeks ago.  Spontaneously resolved.  Patient had cauterization done.  She denies any unintentional weight loss, fever, chills, night sweats.  She has hot flash/postmenopausal symptoms. Patient reports that she had history of hysterectomy and later bilateral lumpectomy as well due to ovarian cyst. Family history is positive for breast cancer in maternal grandmother, maternal aunt, cousin. She reports being genetic tested 10 to 15 years ago with a comprehensive panel at that time and was negative.  Review of Systems  Constitutional: Negative for appetite change, chills, fatigue and fever.  HENT:   Negative for hearing loss and voice change.   Eyes: Negative for eye problems.  Respiratory: Negative  for chest tightness, cough and shortness of breath.        Shortness of breath with exertion  Cardiovascular: Negative for chest pain.  Gastrointestinal: Negative for abdominal distention, abdominal pain and blood in stool.  Endocrine: Negative for hot flashes.  Genitourinary: Negative for difficulty urinating and frequency.   Musculoskeletal: Negative for arthralgias.  Skin: Negative for itching and rash.  Neurological: Negative for extremity weakness.  Hematological: Negative for adenopathy. Bruises/bleeds easily.  Psychiatric/Behavioral: Negative for confusion.    MEDICAL HISTORY:  Past Medical History:  Diagnosis Date  . GERD (gastroesophageal reflux disease)   . Heart murmur   . Migraine   . Pancreas divisum     SURGICAL HISTORY: Past Surgical History:  Procedure Laterality Date  . ABDOMINAL HYSTERECTOMY    . NASAL SINUS SURGERY      SOCIAL HISTORY: Social History   Socioeconomic History  . Marital status: Married    Spouse name: Not on file  . Number of children: Not on file  . Years of education: Not on file  . Highest education level: Not on file  Occupational History  . Not on file  Tobacco Use  . Smoking status: Never Smoker  . Smokeless tobacco: Never Used  Substance and Sexual Activity  . Alcohol use: No  . Drug use: Never  . Sexual activity: Yes  Other Topics Concern  . Not on file  Social History Narrative  . Not on file   Social Determinants of Health   Financial Resource Strain:   . Difficulty of Paying Living Expenses:   Food Insecurity:   . Worried About Charity fundraiser in  the Last Year:   . [place] in the Last Year:   Transportation Needs:   . Film/video editor (Medical):   Marland Kitchen Lack of Transportation (Non-Medical):   Physical Activity:   . Days of Exercise per Week:   . Minutes of Exercise per Session:   Stress:   . Feeling of Stress :   Social Connections:   . Frequency of Communication with Friends and Family:     . Frequency of Social Gatherings with Friends and Family:   . Attends Religious Services:   . Active Member of Clubs or Organizations:   . Attends Archivist Meetings:   Marland Kitchen Marital Status:   Intimate Partner Violence:   . Fear of Current or Ex-Partner:   . Emotionally Abused:   Marland Kitchen Physically Abused:   . Sexually Abused:     FAMILY HISTORY: Family History  Problem Relation Age of Onset  . Heart failure Mother   . Hypertension Mother   . Esophageal cancer Mother   . Heart failure Father   . Hypertension Father   . Breast cancer Maternal Aunt 35  . Breast cancer Maternal Grandmother 32  . Breast cancer Cousin 20    ALLERGIES:  is allergic to imitrex [sumatriptan] and percocet [oxycodone-acetaminophen].  MEDICATIONS:  Current Outpatient Medications  Medication Sig Dispense Refill  . eletriptan (RELPAX) 40 MG tablet Take 40 mg by mouth as needed for migraine or headache. May repeat in 2 hours if headache persists or recurs.     Marland Kitchen esomeprazole (NEXIUM) 40 MG capsule Take 1 capsule (40 mg total) by mouth daily. 30 capsule 0  . fexofenadine (ALLEGRA) 180 MG tablet Take 180 mg by mouth daily.     . fluticasone (FLONASE) 50 MCG/ACT nasal spray Place 2 sprays into both nostrils daily as needed for allergies or rhinitis.     . JUBLIA 10 % SOLN Apply 1 application topically as needed (recurring nail fungus).     . Magnesium Oxide 250 MG TABS Take 250 mg by mouth daily.     . Multiple Vitamins-Minerals (ONE-A-DAY 50 PLUS PO) Take 1 tablet by mouth daily.     . nitroGLYCERIN (NITROSTAT) 0.4 MG SL tablet Place 1 tablet (0.4 mg total) under the tongue every 5 (five) minutes as needed for chest pain. 30 tablet 0  . ondansetron (ZOFRAN-ODT) 8 MG disintegrating tablet Take 8 mg by mouth every 8 (eight) hours as needed for nausea or vomiting.    Marland Kitchen PAPAYA PO Take by mouth. Prn for pancreas    . topiramate (TOPAMAX) 100 MG tablet Take 100 mg by mouth 2 (two) times daily.    . metoprolol  tartrate (LOPRESSOR) 100 MG tablet Take 1 tablet (100 mg) by mouth 2 hours prior to your Cardiac CTA x 1 dose (Patient not taking: Reported on 01/15/2020) 1 tablet 0   No current facility-administered medications for this visit.     PHYSICAL EXAMINATION: ECOG PERFORMANCE STATUS: 0 - Asymptomatic Vitals:   01/16/20 0944  BP: 133/83  Pulse: 69  Resp: 18  Temp: (!) 97.1 F (36.2 C)   Filed Weights   01/16/20 0944  Weight: 200 lb (90.7 kg)    Physical Exam Constitutional:      General: She is not in acute distress. HENT:     Head: Normocephalic and atraumatic.  Eyes:     General: No scleral icterus. Cardiovascular:     Rate and Rhythm: Normal rate and regular rhythm.  Heart sounds: Normal heart sounds.  Pulmonary:     Effort: Pulmonary effort is normal. No respiratory distress.     Breath sounds: No wheezing.  Abdominal:     General: Bowel sounds are normal. There is no distension.     Palpations: Abdomen is soft.  Musculoskeletal:        General: No deformity. Normal range of motion.     Cervical back: Normal range of motion and neck supple.  Skin:    General: Skin is warm and dry.     Findings: No erythema or rash.     Comments: Multiple bruising spots on bilateral upper and lower extremities, -1cm  Neurological:     Mental Status: She is alert and oriented to person, place, and time. Mental status is at baseline.     Cranial Nerves: No cranial nerve deficit.     Coordination: Coordination normal.  Psychiatric:        Mood and Affect: Mood normal.     LABORATORY DATA:  I have reviewed the data as listed Lab Results  Component Value Date   WBC 5.2 01/07/2020   HGB 14.1 01/07/2020   HCT 43.3 01/07/2020   MCV 95.6 01/07/2020   PLT 246 01/07/2020   Recent Labs    06/05/19 1240 01/07/20 0945  NA 141 140  K 3.6 3.7  CL 110 109  CO2 23 25  GLUCOSE 90 110*  BUN 12 17  CREATININE 0.52 0.63  CALCIUM 9.0 8.7*  GFRNONAA >60 >60  GFRAA >60 >60    Iron/TIBC/Ferritin/ %Sat No results found for: IRON, TIBC, FERRITIN, IRONPCTSAT    RADIOGRAPHIC STUDIES: I have personally reviewed the radiological images as listed and agreed with the findings in the report. DG Chest 2 View  Result Date: 01/07/2020 CLINICAL DATA:  Chest pain radiating to left upper extremity EXAM: CHEST - 2 VIEW COMPARISON:  July 18, 2016. FINDINGS: Lungs are clear. Heart size and pulmonary vascularity are normal. No adenopathy. No bone lesions. No pneumothorax. IMPRESSION: Lungs clear.  Cardiac silhouette within normal limits. Electronically Signed   By: Lowella Grip III M.D.   On: 01/07/2020 10:18   CT Angio Chest PE W and/or Wo Contrast  Result Date: 01/07/2020 CLINICAL DATA:  Chest pain EXAM: CT ANGIOGRAPHY CHEST WITH CONTRAST TECHNIQUE: Multidetector CT imaging of the chest was performed using the standard protocol during bolus administration of intravenous contrast. Multiplanar CT image reconstructions and MIPs were obtained to evaluate the vascular anatomy. CONTRAST:  71mL OMNIPAQUE IOHEXOL 350 MG/ML SOLN COMPARISON:  Chest x-ray 01/07/2020 FINDINGS: Cardiovascular: Satisfactory opacification of the pulmonary arteries to the segmental level. No evidence of pulmonary embolism. Normal heart size. No pericardial effusion. Thoracic aorta is normal in course and caliber. Minimal atherosclerotic calcification of the aortic arch. Mediastinum/Nodes: No enlarged mediastinal, hilar, or axillary lymph nodes. Thyroid gland, trachea, and esophagus demonstrate no significant findings. Lungs/Pleura: Lungs are clear. No pleural effusion or pneumothorax. Upper Abdomen: No acute abnormality. Musculoskeletal: No chest wall abnormality. No acute or significant osseous findings. Review of the MIP images confirms the above findings. IMPRESSION: 1. Negative for pulmonary embolism or other acute intrathoracic abnormality. 2. Aortic atherosclerosis. (ICD10-I70.0). Electronically Signed   By:  Davina Poke D.O.   On: 01/07/2020 14:14      ASSESSMENT & PLAN:  1. Easy bruising    Discussed with patient that most of her bruising spots are on her extremities, which may be associated with trauma,  Easy bruising seems to be a  new symptoms for her.  She previously underwent surgeries without bleeding complications.  Her medication list was reviewed. No B symptoms, no lymphadenopathy on physical examination. CBC showed normal platelet counts.  Normal liver function Check PT, PTT, platelet function assay, vitamin C level, check von Willebrand panel  Orders Placed This Encounter  Procedures  . Platelet function assay    Standing Status:   Future    Number of Occurrences:   1    Standing Expiration Date:   01/15/2021  . Protime-INR    Standing Status:   Future    Number of Occurrences:   1    Standing Expiration Date:   01/15/2021  . APTT    Standing Status:   Future    Number of Occurrences:   1    Standing Expiration Date:   01/15/2021  . Vitamin C    Standing Status:   Future    Number of Occurrences:   1    Standing Expiration Date:   01/15/2021  . Von Willebrand panel    Standing Status:   Future    Number of Occurrences:   1    Standing Expiration Date:   01/15/2021    All questions were answered. The patient knows to call the clinic with any problems questions or concerns.  cc [name], MD    Return of visit: 2 weeks for discussion of blood results. Thank you for this kind referral and the opportunity to participate in the care of this patient. A copy of today's note is routed to referring provider    Earlie Server, MD, PhD Hematology Oncology [facility] Pager- [phone] 01/16/2020

## 2020-01-19 ENCOUNTER — Telehealth (HOSPITAL_COMMUNITY): Payer: Self-pay | Admitting: *Deleted

## 2020-01-19 LAB — VON WILLEBRAND PANEL
Coagulation Factor VIII: 145 % — ABNORMAL HIGH (ref 56–140)
Ristocetin Co-factor, Plasma: 74 % (ref 50–200)
Von Willebrand Antigen, Plasma: 128 % (ref 50–200)

## 2020-01-19 LAB — COAG STUDIES INTERP REPORT

## 2020-01-19 NOTE — Telephone Encounter (Signed)
Pt returned call concerning cardiac CT scan on Jan 22, 2020.  Instructions reviewed and pt expressed understanding.  Call back number given in case pt has any additional questions.

## 2020-01-19 NOTE — Telephone Encounter (Signed)
Attempted to call patient regarding upcoming cardiac CT appointment. Left message on voicemail with name and callback number Trapper Meech Tai RN Navigator Cardiac Imaging [facility] [phone] Office [phone] Cell 

## 2020-01-21 LAB — VITAMIN C: Vitamin C: 1.6 mg/dL (ref 0.4–2.0)

## 2020-01-22 ENCOUNTER — Other Ambulatory Visit: Payer: Self-pay

## 2020-01-22 ENCOUNTER — Ambulatory Visit
Admission: RE | Admit: 2020-01-22 | Discharge: 2020-01-22 | Disposition: A | Discharge status: Home / Self Care | Payer: PRIVATE HEALTH INSURANCE | Source: Ambulatory Visit | Attending: Cardiovascular Disease | Admitting: Cardiovascular Disease

## 2020-01-22 DIAGNOSIS — R072 Precordial pain: Secondary | ICD-10-CM

## 2020-01-22 MED ORDER — METOPROLOL TARTRATE 5 MG/5ML IV SOLN
10.0000 mg | Freq: Once | INTRAVENOUS | Status: AC
  Administered 2020-01-22: 10 mg via INTRAVENOUS

## 2020-01-22 MED ORDER — NITROGLYCERIN 0.4 MG SL SUBL
0.8000 mg | SUBLINGUAL_TABLET | Freq: Once | SUBLINGUAL | Status: AC
  Administered 2020-01-22: 0.8 mg via SUBLINGUAL

## 2020-01-22 MED ORDER — METOPROLOL TARTRATE 5 MG/5ML IV SOLN
5.0000 mg | INTRAVENOUS | Status: DC | PRN
  Administered 2020-01-22 (×2): 5 mg via INTRAVENOUS

## 2020-01-22 MED ORDER — IOHEXOL 350 MG/ML SOLN
100.0000 mL | Freq: Once | INTRAVENOUS | Status: AC | PRN
  Administered 2020-01-22: 100 mL via INTRAVENOUS

## 2020-01-22 NOTE — Progress Notes (Signed)
Patient tolerated CT well. Stated had a headache of nitro administration 8/10 throbbing. Drank a cola and headache improved 1/10 throbbing states feels a lot better. Ambulatory to exit steady gait.

## 2020-01-26 MED ORDER — AMLODIPINE BESYLATE 2.5 MG PO TABS
2.5000 mg | ORAL_TABLET | Freq: Every day | ORAL | 3 refills | Status: DC
Start: 2020-01-26 — End: 2023-07-03

## 2020-01-26 NOTE — Telephone Encounter (Signed)
Call to patient to discuss POC from Laurann Montana, NP. Pt cooperative with POC. Rx sent to patient preferred pharmacy.   Pt is inquiring about which cholesterol medication NP advises based on cardiac CT results and [facility] labs.   Routing to provider.

## 2020-01-30 ENCOUNTER — Inpatient Hospital Stay (HOSPITAL_BASED_OUTPATIENT_CLINIC_OR_DEPARTMENT_OTHER): Payer: PRIVATE HEALTH INSURANCE | Admitting: Oncology

## 2020-01-30 ENCOUNTER — Encounter: Payer: Self-pay | Admitting: Oncology

## 2020-01-30 DIAGNOSIS — R238 Other skin changes: Secondary | ICD-10-CM | POA: Diagnosis not present

## 2020-01-30 DIAGNOSIS — R233 Spontaneous ecchymoses: Secondary | ICD-10-CM

## 2020-01-30 NOTE — Progress Notes (Signed)
Pt contacted for Mychart visit. No new concerns voiced.  

## 2020-01-30 NOTE — Progress Notes (Signed)
HEMATOLOGY-ONCOLOGY TeleHEALTH VISIT PROGRESS NOTE  I connected with Margaret Bryan on 01/30/20 at  2:00 PM EDT by video enabled telemedicine visit and verified that I am speaking with the correct person using two identifiers. I discussed the limitations, risks, security and privacy concerns of performing an evaluation and management service by telemedicine and the availability of in-person appointments. I also discussed with the patient that there may be a patient responsible charge related to this service. The patient expressed understanding and agreed to proceed.   Other persons participating in the visit and their role in the encounter:  None  [place] office Chief Complaint: Easy bruising   INTERVAL HISTORY Margaret Bryan is a 59 y.o. female who has above history reviewed by me today presents for follow up visit for management of easy bruising Problems and complaints are listed below:  Patient presents for follow-up and discussion of work-up results.  Review of Systems - Oncology  Past Medical History:  Diagnosis Date  . GERD (gastroesophageal reflux disease)   . Heart murmur   . Migraine   . Pancreas divisum    Past Surgical History:  Procedure Laterality Date  . ABDOMINAL HYSTERECTOMY    . NASAL SINUS SURGERY      Family History  Problem Relation Age of Onset  . Heart failure Mother   . Hypertension Mother   . Esophageal cancer Mother   . Heart failure Father   . Hypertension Father   . Breast cancer Maternal Aunt 35  . Breast cancer Maternal Grandmother 75  . Breast cancer Cousin 20    Social History   Socioeconomic History  . Marital status: Married    Spouse name: Not on file  . Number of children: Not on file  . Years of education: Not on file  . Highest education level: Not on file  Occupational History  . Not on file  Tobacco Use  . Smoking status: Never Smoker  . Smokeless tobacco: Never Used  Substance and  Sexual Activity  . Alcohol use: No  . Drug use: Never  . Sexual activity: Yes  Other Topics Concern  . Not on file  Social History Narrative  . Not on file   Social Determinants of Health   Financial Resource Strain:   . Difficulty of Paying Living Expenses:   Food Insecurity:   . Worried About Charity fundraiser in the Last Year:   . Arboriculturist in the Last Year:   Transportation Needs:   . Film/video editor (Medical):   Marland Kitchen Lack of Transportation (Non-Medical):   Physical Activity:   . Days of Exercise per Week:   . Minutes of Exercise per Session:   Stress:   . Feeling of Stress :   Social Connections:   . Frequency of Communication with Friends and Family:   . Frequency of Social Gatherings with Friends and Family:   . Attends Religious Services:   . Active Member of Clubs or Organizations:   . Attends Archivist Meetings:   Marland Kitchen Marital Status:   Intimate Partner Violence:   . Fear of Current or Ex-Partner:   . Emotionally Abused:   Marland Kitchen Physically Abused:   . Sexually Abused:     Current Outpatient Medications on File Prior to Visit  Medication Sig Dispense Refill  . amLODipine (NORVASC) 2.5 MG tablet Take 1 tablet (2.5 mg total) by mouth daily. 90 tablet 3  . eletriptan (RELPAX)  40 MG tablet Take 40 mg by mouth as needed for migraine or headache. May repeat in 2 hours if headache persists or recurs.     Marland Kitchen esomeprazole (NEXIUM) 40 MG capsule Take 1 capsule (40 mg total) by mouth daily. 30 capsule 0  . fexofenadine (ALLEGRA) 180 MG tablet Take 180 mg by mouth daily.     . fluticasone (FLONASE) 50 MCG/ACT nasal spray Place 2 sprays into both nostrils daily as needed for allergies or rhinitis.     . JUBLIA 10 % SOLN Apply 1 application topically as needed (recurring nail fungus).     . Magnesium Oxide 250 MG TABS Take 250 mg by mouth daily.     . metoprolol tartrate (LOPRESSOR) 100 MG tablet Take 1 tablet (100 mg) by mouth 2 hours prior to your Cardiac CTA  x 1 dose 1 tablet 0  . Multiple Vitamins-Minerals (ONE-A-DAY 50 PLUS PO) Take 1 tablet by mouth daily.     . nitroGLYCERIN (NITROSTAT) 0.4 MG SL tablet Place 1 tablet (0.4 mg total) under the tongue every 5 (five) minutes as needed for chest pain. 30 tablet 0  . ondansetron (ZOFRAN-ODT) 8 MG disintegrating tablet Take 8 mg by mouth every 8 (eight) hours as needed for nausea or vomiting.    Marland Kitchen PAPAYA PO Take by mouth. Prn for pancreas    . topiramate (TOPAMAX) 100 MG tablet Take 100 mg by mouth 2 (two) times daily.     No current facility-administered medications on file prior to visit.    Allergies  Allergen Reactions  . Imitrex [Sumatriptan]   . Percocet [Oxycodone-Acetaminophen] Other (See Comments)    Hallucinations, rapid heartbeat       Observations/Objective: Today's Vitals   01/30/20 1315  PainSc: 0-No pain   There is no height or weight on file to calculate BMI.  Physical Exam  Constitutional: No distress.  Neurological: She is alert.    CBC    Component Value Date/Time   WBC 5.2 01/07/2020 0945   RBC 4.53 01/07/2020 0945   HGB 14.1 01/07/2020 0945   HGB 13.4 07/08/2013 1747   HCT 43.3 01/07/2020 0945   HCT 40.7 07/08/2013 1747   PLT 246 01/07/2020 0945   PLT 224 07/08/2013 1747   MCV 95.6 01/07/2020 0945   MCV 91 07/08/2013 1747   MCH 31.1 01/07/2020 0945   MCHC 32.6 01/07/2020 0945   RDW 13.2 01/07/2020 0945   RDW 13.0 07/08/2013 1747   LYMPHSABS 2.1 07/01/2018 1444   LYMPHSABS 2.2 07/08/2013 1747   MONOABS 0.3 07/01/2018 1444   MONOABS 0.4 07/08/2013 1747   EOSABS 0.1 07/01/2018 1444   EOSABS 0.1 07/08/2013 1747   BASOSABS 0.0 07/01/2018 1444   BASOSABS 0.0 07/08/2013 1747    CMP     Component Value Date/Time   NA 140 01/07/2020 0945   NA 142 07/08/2013 1747   K 3.7 01/07/2020 0945   K 3.8 07/08/2013 1747   CL 109 01/07/2020 0945   CL 104 07/08/2013 1747   CO2 25 01/07/2020 0945   CO2 26 07/08/2013 1747   GLUCOSE 110 (H) 01/07/2020 0945    GLUCOSE 94 07/08/2013 1747   BUN 17 01/07/2020 0945   BUN 17 07/08/2013 1747   CREATININE 0.63 01/07/2020 0945   CREATININE 0.81 07/08/2013 1747   CALCIUM 8.7 (L) 01/07/2020 0945   CALCIUM 8.7 07/08/2013 1747   PROT 7.5 07/01/2018 1444   PROT 7.4 07/08/2013 1747   ALBUMIN 4.4 07/01/2018 1444  ALBUMIN 4.0 07/08/2013 1747   AST 22 07/01/2018 1444   AST 17 07/08/2013 1747   ALT 19 07/01/2018 1444   ALT 29 07/08/2013 1747   ALKPHOS 99 07/01/2018 1444   ALKPHOS 106 07/08/2013 1747   BILITOT 0.9 07/01/2018 1444   BILITOT 0.3 07/08/2013 1747   GFRNONAA >60 01/07/2020 0945   GFRNONAA >60 07/08/2013 1747   GFRAA >60 01/07/2020 0945   GFRAA >60 07/08/2013 1747     Assessment and Plan: 1. Easy bruising     #Easy bruising, Lab work-up was discussed with patient. Patient has normal PT, PTT, normal platelet counts, normal platelet function Normal vitamin C level, von Willebrand panel is not consistent with von Willebrand deficiency.  Patient has slightly elevated factor VIII activity.  Discussed with patient that persistent factor VIII activity is related to increased thrombosis risk.  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  Currently patient's factor VIII activity is only mildly elevated, likely reactive.  Follow Up Instructions: No follow-up needed.  Advised patient to call clinic if she develops worsening symptoms or concerns.   I discussed the assessment and treatment plan with the patient. The patient was provided an opportunity to ask questions and all were answered. The patient agreed with the plan and demonstrated an understanding of the instructions.  The patient was advised to call back or seek an in-person evaluation if the symptoms worsen or if the condition fails to improve as anticipated.   Earlie Server, MD 01/30/2020 4:52 PM

## 2020-02-02 NOTE — Progress Notes (Signed)
Virtual Visit via Video Note   This visit type was conducted due to national recommendations for restrictions regarding the COVID-19 Pandemic (e.g. social distancing) in an effort to limit this patient's exposure and mitigate transmission in our community.  Due to her co-morbid illnesses, this patient is at least at moderate risk for complications without adequate follow up.  This format is felt to be most appropriate for this patient at this time.  All issues noted in this document were discussed and addressed.  A limited physical exam was performed with this format.  Please refer to the patient's chart for her consent to telehealth for [facility].   I connected with  [name] on 02/04/20 by a video enabled telemedicine application and verified that I am speaking with the correct person using two identifiers. I discussed the limitations of evaluation and management by telemedicine. The patient expressed understanding and agreed to proceed.   Evaluation Performed:  Follow-up visit  Date:  02/04/2020   ID:  Margaret Bryan, DOB Jul 03, 1961, MRN [id number]  Patient Location:  [place] [id number]   Provider location:   [facility], [place] office  PCP:  [name], MD  Cardiologist:  Arvid Right [facility]  Chief Complaint  Patient presents with  . Post hospital/ follow up Cardiac CT    chest pain/ lightheadedness/ diarrhea x 5 days    History of Present Illness:    Margaret Bryan is a 59 y.o. female who presents via audio/video conferencing for a telehealth visit today.   The patient does not symptoms concerning for COVID-19 infection (fever, chills, cough, or new SHORTNESS OF BREATH).   Patient has a past medical history of  chronic pancreatitis, hyperlipidemia presenting to the hospital January 08, 2020 with chest pain concerning for angina Who presents for follow-up of her chest pain  non-smoker, no diabetes On amlodipine, 2.5 mg daily Has  lightheaded, feels it is from the medication GI upset, started in the past 5 days Chest tightness, left arm, face numb is somewhat better on the amlodipine, low-grade 2/10  Home BP: 110/70-130 Not having classic orthostasis symptoms on the amlodipine, just lightheaded all the time It is not vertigo  Total cholesterol in the low 200s   Past medical history reviewed Previous episodes of significant left-sided chest pain radiating to her left jaw left arm Started overnight, woke up in the morning with recurrent symptoms, presented to the fire department EKG nonacute in the hospital Cardiac enzymes negative Markedly hypertensive at EMTs/fire department, high blood pressure persisted for several hours  Cardiac CTA Jan 22, 2020 1. Coronary calcium score of 0. Patient is low risk for near term coronary events 2. Normal coronary origin with right dominance. 3. No evidence of CAD. 4.CAD-RADS 0. Consider non-atherosclerotic causes of chest pain   Prior CV studies:   The following studies were reviewed today:    Past Medical History:  Diagnosis Date  . GERD (gastroesophageal reflux disease)   . Heart murmur   . Migraine   . Pancreas divisum    Past Surgical History:  Procedure Laterality Date  . ABDOMINAL HYSTERECTOMY    . NASAL SINUS SURGERY        Allergies:   Imitrex [sumatriptan] and Percocet [oxycodone-acetaminophen]   Social History   Tobacco Use  . Smoking status: Never Smoker  . Smokeless tobacco: Never Used  Substance Use Topics  . Alcohol use: No  . Drug use: Never  Current Outpatient Medications on File Prior to Visit  Medication Sig Dispense Refill  . amLODipine (NORVASC) 2.5 MG tablet Take 1 tablet (2.5 mg total) by mouth daily. 90 tablet 3  . eletriptan (RELPAX) 40 MG tablet Take 40 mg by mouth as needed for migraine or headache. May repeat in 2 hours if headache persists or recurs.     Marland Kitchen esomeprazole (NEXIUM) 40 MG capsule Take 1 capsule (40 mg  total) by mouth daily. 30 capsule 0  . fexofenadine (ALLEGRA) 180 MG tablet Take 180 mg by mouth daily.     . fluticasone (FLONASE) 50 MCG/ACT nasal spray Place 2 sprays into both nostrils daily as needed for allergies or rhinitis.     . JUBLIA 10 % SOLN Apply 1 application topically as needed (recurring nail fungus).     . Magnesium Oxide 250 MG TABS Take 250 mg by mouth daily.     . Multiple Vitamins-Minerals (ONE-A-DAY 50 PLUS PO) Take 1 tablet by mouth daily.     . nitroGLYCERIN (NITROSTAT) 0.4 MG SL tablet Place 1 tablet (0.4 mg total) under the tongue every 5 (five) minutes as needed for chest pain. 30 tablet 0  . ondansetron (ZOFRAN-ODT) 8 MG disintegrating tablet Take 8 mg by mouth every 8 (eight) hours as needed for nausea or vomiting.    Marland Kitchen PAPAYA PO Take by mouth. Prn for pancreas    . topiramate (TOPAMAX) 100 MG tablet Take 100 mg by mouth 2 (two) times daily.     No current facility-administered medications on file prior to visit.     Family Hx: The patient's family history includes Breast cancer (age of onset: 85) in her cousin; Breast cancer (age of onset: 3) in her maternal aunt; Breast cancer (age of onset: 68) in her maternal grandmother; Esophageal cancer in her mother; Heart failure in her father and mother; Hypertension in her father and mother.  ROS:   Please see the history of present illness.    Review of Systems  Constitutional: Negative.   HENT: Negative.   Respiratory: Negative.   Cardiovascular: Positive for chest pain.  Gastrointestinal: Negative.   Musculoskeletal: Negative.   Neurological: Negative.   Psychiatric/Behavioral: Negative.   All other systems reviewed and are negative.     Labs/Other Tests and Data Reviewed:    Recent Labs: 01/07/2020: BUN 17; Creatinine, Ser 0.63; Hemoglobin 14.1; Platelets 246; Potassium 3.7; Sodium 140   Recent Lipid Panel No results found for: CHOL, TRIG, HDL, CHOLHDL, LDLCALC, LDLDIRECT  Wt Readings from Last 3  Encounters:  02/04/20 193 lb (87.5 kg)  01/16/20 200 lb (90.7 kg)  01/15/20 198 lb 4 oz (89.9 kg)     Exam:    Vital Signs: Vital signs may also be detailed in the HPI BP 129/77   Pulse 69   Ht 5\' 8"  (1.727 m)   Wt 193 lb (87.5 kg)   BMI 29.35 kg/m   Wt Readings from Last 3 Encounters:  02/04/20 193 lb (87.5 kg)  01/16/20 200 lb (90.7 kg)  01/15/20 198 lb 4 oz (89.9 kg)   Temp Readings from Last 3 Encounters:  01/16/20 (!) 97.1 F (36.2 C)  01/08/20 98.2 F (36.8 C)  12/02/19 98.1 F (36.7 C) (Oral)   BP Readings from Last 3 Encounters:  02/04/20 129/77  01/22/20 114/62  01/16/20 133/83   Pulse Readings from Last 3 Encounters:  02/04/20 69  01/22/20 60  01/16/20 69     Well nourished, well developed female  in no acute distress. Constitutional:  oriented to person, place, and time. No distress.    ASSESSMENT & PLAN:    Problem List Items Addressed This Visit    Chest pain - Primary    Other Visit Diagnoses    Hyperlipidemia, unspecified hyperlipidemia type       Elevated BP without diagnosis of hypertension       DOE (dyspnea on exertion)         Atypical chest discomfort Unable to exclude coronary spasm Cardiac CTA discussed, no significant coronary disease Structural abnormality noted Was started on amloidpine 2.5mg  daily  but has lightheadedness -recommend we try to cut to cut the medication in half  she could even try amlodipine 2.5 mg half pill twice a day If unable to tolerate amlodipine secondary to lightheadedness, could try Ranexa 500 twice a day with titration upwards if needed -Of note there has been some improvement in chest discomfort arm pain on low-dose amlodipine --We have recommended that she send [place] some messages in the next week or so to detail her symptoms on half dose amlodipine or amlodipine half dose twice a day For persistent symptoms would need to hold the amlodipine altogether  Hyperlipidemia Discussed in detail, numbers are  average, no coronary calcification noted on recent CT scan, No plan to start cholesterol medication at this time and effort to avoid polypharmacy and side effects    COVID-19 Education: The signs and symptoms of COVID-19 were discussed with the patient and how to seek care for testing (follow up with PCP or arrange E-visit).  The importance of social distancing was discussed today.  Patient Risk:   After full review of this patients clinical status, I feel that they are at least moderate risk at this time.  Time:   Today, I have spent 25 minutes with the patient with telehealth technology discussing the cardiac and medical problems/diagnoses detailed above   Additional 10 min spent reviewing the chart prior to patient visit today   Medication Adjustments/Labs and Tests Ordered: Current medicines are reviewed at length with the patient today.  Concerns regarding medicines are outlined above.   Tests Ordered: No tests ordered   Medication Changes: No changes made   Disposition: Follow-up in 6 months   Signed, Ida Rogue, MD  [facility] [facility] [address] [place] [address], [place], [place] [address]

## 2020-02-04 ENCOUNTER — Telehealth (INDEPENDENT_AMBULATORY_CARE_PROVIDER_SITE_OTHER): Payer: PRIVATE HEALTH INSURANCE | Admitting: Cardiovascular Disease

## 2020-02-04 ENCOUNTER — Encounter: Payer: Self-pay | Admitting: Cardiovascular Disease

## 2020-02-04 ENCOUNTER — Other Ambulatory Visit: Payer: Self-pay

## 2020-02-04 VITALS — BP 129/77 | HR 69 | Ht 68.0 in | Wt 193.0 lb

## 2020-02-04 DIAGNOSIS — R0782 Intercostal pain: Secondary | ICD-10-CM

## 2020-02-04 DIAGNOSIS — E785 Hyperlipidemia, unspecified: Secondary | ICD-10-CM

## 2020-02-04 DIAGNOSIS — R030 Elevated blood-pressure reading, without diagnosis of hypertension: Secondary | ICD-10-CM

## 2020-02-04 DIAGNOSIS — R0600 Dyspnea, unspecified: Secondary | ICD-10-CM | POA: Diagnosis not present

## 2020-02-04 DIAGNOSIS — R0609 Other forms of dyspnea: Secondary | ICD-10-CM

## 2020-02-04 NOTE — Patient Instructions (Addendum)
Medication Instructions:  Having lightheadedness on amlodipine 2.5 daily, being used for coronary spasm -Recommend taking a few days off the amlodipine, get the lightheadedness to go away -Then consider restarting half dose amlodipine once a day If no lightheadedness and having continued symptoms of chest discomfort could try amlodipine one half dose twice daily -For continued lightheadedness would need to stop the amlodipine altogether and try alternate medication, Possibly Ranexa    If you need a refill on your cardiac medications before your next appointment, please call your pharmacy.    Lab work: No new labs needed   If you have labs (blood work) drawn today and your tests are completely normal, you will receive your results only by: Marland Kitchen MyChart Message (if you have MyChart) OR . A paper copy in the mail If you have any lab test that is abnormal or we need to change your treatment, we will call you to review the results.   Testing/Procedures: No new testing needed   Follow-Up: At [facility], you and your health needs are our priority.  As part of our continuing mission to provide you with exceptional heart care, we have created designated Provider Care Teams.  These Care Teams include your primary Cardiologist (physician) and Advanced Practice Providers (APPs -  Physician Assistants and Nurse Practitioners) who all work together to provide you with the care you need, when you need it.  . You will need a follow up appointment as needed   . Providers on your designated Care Team:   . Murray Hodgkins, NP . Christell Faith, PA-C . Marrianne Mood, PA-C  Any Other Special Instructions Will Be Listed Below (If Applicable).    COVID-19 Vaccine Information can be found at: [email or web address].uk For questions related to vaccine distribution or appointments, please email vaccine@[email or web address] or call 4050635094.

## 2020-02-07 ENCOUNTER — Other Ambulatory Visit: Payer: Self-pay

## 2020-02-07 ENCOUNTER — Encounter: Payer: Self-pay | Admitting: Emergency Medicine

## 2020-02-07 ENCOUNTER — Ambulatory Visit
Admission: EM | Admit: 2020-02-07 | Discharge: 2020-02-07 | Disposition: A | Discharge status: Home / Self Care | Payer: PRIVATE HEALTH INSURANCE | Attending: Family Medicine | Admitting: Family Medicine

## 2020-02-07 DIAGNOSIS — J029 Acute pharyngitis, unspecified: Secondary | ICD-10-CM | POA: Diagnosis present

## 2020-02-07 DIAGNOSIS — H66001 Acute suppurative otitis media without spontaneous rupture of ear drum, right ear: Secondary | ICD-10-CM | POA: Insufficient documentation

## 2020-02-07 DIAGNOSIS — R220 Localized swelling, mass and lump, head: Secondary | ICD-10-CM | POA: Diagnosis present

## 2020-02-07 LAB — GROUP A STREP BY PCR: Group A Strep by PCR: NOT DETECTED

## 2020-02-07 MED ORDER — AMOXICILLIN-POT CLAVULANATE 875-125 MG PO TABS: 1.0000 | ORAL_TABLET | Freq: Two times a day (BID) | ORAL | 0 refills | Status: AC

## 2020-02-07 NOTE — ED Triage Notes (Signed)
Patient c/o right sided facial swelling, right ear pressure and sore throat that started yesterday.  Patient denies fevers.

## 2020-02-07 NOTE — ED Provider Notes (Signed)
[place]   Name: Khyli Weyer DOB: 12-30-1960 MRN: [id number] CSN: [id number] PCP: [name], MD  Arrival date and time:  02/07/20 1020  Chief Complaint:  Sore Throat  NOTE: Prior to seeing the patient today, I have reviewed the triage nursing documentation and vital signs. Clinical staff has updated patient's PMH/PSHx, current medication list, and drug allergies/intolerances to ensure comprehensive history available to assist in medical decision making.   History:   HPI: Luane Littig is a 59 y.o. female who presents today with complaints of "not feeling well all week". Patient with RIGHT otalgia, swelling in the RIGHT side of her face, and pain/swelling on the RIGHT side of her throat only. Patient denies any associated fevers or chills. Symptoms started with acute onset yesterday. Due to the pain, patient reports that she is having difficulties swallowing. She denies shortness of breath, wheezing, or stridor. PMH (+) for seasonal allergies for which patient takes daily fexofenadine. Patient complains of a "popping" sensation in her RIGHT ear. She denies that she has experienced any nausea, vomiting, diarrhea, or abdominal pain. She is eating and drinking well. Patient denies any perceived alterations to her sense of taste or smell. Patient denies being in close contact with anyone known to be ill. Despite her symptoms, patient has not taken any over the counter interventions to help improve/relieve her reported symptoms at home.   Past Medical History:  Diagnosis Date  . GERD (gastroesophageal reflux disease)   . Heart murmur   . Migraine   . Pancreas divisum     Past Surgical History:  Procedure Laterality Date  . ABDOMINAL HYSTERECTOMY    . NASAL SINUS SURGERY      Family History  Problem Relation Age of Onset  . Heart failure Mother   . Hypertension Mother   . Esophageal cancer Mother   . Heart failure Father   . Hypertension Father   . Breast cancer Maternal  Aunt 35  . Breast cancer Maternal Grandmother 40  . Breast cancer Cousin 20    Social History   Tobacco Use  . Smoking status: Never Smoker  . Smokeless tobacco: Never Used  Substance Use Topics  . Alcohol use: No  . Drug use: Never    Patient Active Problem List   Diagnosis Date Noted  . History of pancreatitis   . Migraine   . Chest pain 01/07/2020  . H/O: hysterectomy 03/22/2018    Home Medications:    Current Meds  Medication Sig  . eletriptan (RELPAX) 40 MG tablet Take 40 mg by mouth as needed for migraine or headache. May repeat in 2 hours if headache persists or recurs.   Marland Kitchen esomeprazole (NEXIUM) 40 MG capsule Take 1 capsule (40 mg total) by mouth daily.  . fexofenadine (ALLEGRA) 180 MG tablet Take 180 mg by mouth daily.   . fluticasone (FLONASE) 50 MCG/ACT nasal spray Place 2 sprays into both nostrils daily as needed for allergies or rhinitis.   . Magnesium Oxide 250 MG TABS Take 250 mg by mouth daily.   . Multiple Vitamins-Minerals (ONE-A-DAY 50 PLUS PO) Take 1 tablet by mouth daily.   . ondansetron (ZOFRAN-ODT) 8 MG disintegrating tablet Take 8 mg by mouth every 8 (eight) hours as needed for nausea or vomiting.  Marland Kitchen PAPAYA PO Take by mouth. Prn for pancreas  . topiramate (TOPAMAX) 100 MG tablet Take 100 mg by mouth 2 (two) times daily.    Allergies:   Imitrex [sumatriptan] and Percocet [oxycodone-acetaminophen]  Review of Systems (ROS):  Review of systems NEGATIVE unless otherwise noted in narrative H&P section.   Vital Signs: Today's Vitals   02/07/20 1042 02/07/20 1045 02/07/20 1124  BP:  (!) 138/56   Pulse:  66   Resp:  14   Temp:  98.4 F (36.9 C)   TempSrc:  Oral   SpO2:  100%   Weight: 195 lb (88.5 kg)    Height: 5\' 8"  (1.727 m)    PainSc: 8   8     Physical Exam: Physical Exam  Constitutional: She is oriented to person, place, and time and well-developed, well-nourished, and in no distress.  HENT:  Head: Normocephalic and atraumatic.    Right Ear: There is swelling and tenderness. Tympanic membrane is erythematous and bulging. A middle ear effusion (suppurative) is present.  Left Ear: Tympanic membrane normal.  Nose: Nose normal.  Mouth/Throat: Uvula is midline and mucous membranes are normal. Posterior oropharyngeal edema and posterior oropharyngeal erythema present. No oropharyngeal exudate.  (+) minor pre-auricular swelling. No exudate. Able to handle oral secretions and thin liquids. No SOB, stridor, or wheezing. Phonation normal.   Eyes: Pupils are equal, round, and reactive to light.  Cardiovascular: Normal rate, regular rhythm, normal heart sounds and intact distal pulses.  Pulmonary/Chest: Effort normal and breath sounds normal.  No cough noted in clinic. No SOB or increased WOB. No distress. Able to speak in complete sentences without difficulties. SPO2 100% on RA.  Lymphadenopathy:       Head (right side): Submandibular and preauricular adenopathy present.  Neurological: She is alert and oriented to person, place, and time. Gait normal.  Skin: Skin is warm and dry. No rash noted. She is not diaphoretic.  Psychiatric: Mood, memory, affect and judgment normal.  Nursing note and vitals reviewed.   Urgent Care Treatments / Results:   Orders Placed This Encounter  Procedures  . Group A Strep by PCR    LABS: PLEASE NOTE: all labs that were ordered this encounter are listed, however only abnormal results are displayed. Labs Reviewed  GROUP A STREP BY PCR    EKG: -None  RADIOLOGY: No results found.  PROCEDURES: Procedures  MEDICATIONS RECEIVED THIS VISIT: Medications - No data to display  PERTINENT CLINICAL COURSE NOTES/UPDATES:   Initial Impression / Assessment and Plan / Urgent Care Course:  Pertinent labs & imaging results that were available during my care of the patient were personally reviewed by me and considered in my medical decision making (see lab/imaging section of note for values and  interpretations).  Amora Rubley is a 59 y.o. female who presents to [facility] today with complaints of Sore Throat  Patient is well appearing overall in clinic today. She does not appear to be in any acute distress. Presenting symptoms (see HPI) and exam as documented above. PCR streptococcal throat swab (-). Exam consistent with AOME on the RIGHT and acute pharyngitis. Treating with a 10 day course of oral amoxicillin-clavulanate. Reviewed supportive care measures; rest, increased hydration, warm salt water gargles, hard candies/lozenges, and hot tea with honey/lemon to help soothe the throat and reduce irritation. May use Tylenol and/or Ibuprofen as needed for pain/fever.   Discussed follow up with primary care physician in 1 week for re-evaluation. I have reviewed the follow up and strict return precautions for any new or worsening symptoms. Patient is aware of symptoms that would be deemed urgent/emergent, and would thus require further evaluation either here or in the emergency department. At the  time of discharge, she verbalized understanding and consent with the discharge plan as it was reviewed with her. All questions were fielded by provider and/or clinic staff prior to patient discharge.    Final Clinical Impressions / Urgent Care Diagnoses:   Final diagnoses:  Non-recurrent acute suppurative otitis media of right ear without spontaneous rupture of tympanic membrane  Right facial swelling  Acute pharyngitis, unspecified etiology    New Prescriptions:  [place] Controlled Substance Registry consulted? Not Applicable  Meds ordered this encounter  Medications  . amoxicillin-clavulanate (AUGMENTIN) 875-125 MG tablet    Sig: Take 1 tablet by mouth 2 (two) times daily for 10 days.    Dispense:  20 tablet    Refill:  0    Recommended Follow up Care:  Patient encouraged to follow up with the following provider within the specified time frame, or sooner as dictated by the severity of  her symptoms. As always, she was instructed that for any urgent/emergent care needs, she should seek care either here or in the emergency department for more immediate evaluation.  Follow-up Information    Feldpausch, [name], MD In 1 week.   Specialty: Family Medicine Why: General reassessment of symptoms if not improving Contact information: [place] [id number] [phone]         NOTE: This note was prepared using Dragon dictation software along with smaller phrase technology. Despite my best ability to proofread, there is the potential that transcriptional errors may still occur from this process, and are completely unintentional.    Karen Kitchens, NP 02/07/20 1558

## 2020-02-07 NOTE — Discharge Instructions (Addendum)
It was very nice seeing you today in clinic. Thank you for entrusting me with your care.   Rest and increase fluid intake. Recommend warm salt water gargles, hard candies/lozenges, and hot tea with honey/lemon to help soothe the throat and reduce irritation. May use Tylenol and/or Ibuprofen as needed for pain/fever.   Make arrangements to follow up with your regular doctor in 1 week for re-evaluation if not improving. If your symptoms/condition worsens, please seek follow up care either here or in the ER. Please remember, our [place] providers are "right here with you" when you need [place].   Again, it was my pleasure to take care of you today. Thank you for choosing our clinic. I hope that you start to feel better quickly.   Honor Loh, MSN, APRN, FNP-C, CEN Advanced Practice Provider [facility]

## 2020-02-20 ENCOUNTER — Other Ambulatory Visit: Payer: Self-pay | Admitting: *Deleted

## 2020-02-20 MED ORDER — RANOLAZINE ER 500 MG PO TB12
500.0000 mg | ORAL_TABLET | Freq: Two times a day (BID) | ORAL | 3 refills | Status: DC
Start: 2020-02-20 — End: 2021-02-28

## 2020-02-28 ENCOUNTER — Other Ambulatory Visit: Payer: Self-pay

## 2020-02-28 ENCOUNTER — Ambulatory Visit
Admission: EM | Admit: 2020-02-28 | Discharge: 2020-02-28 | Disposition: A | Discharge status: Home / Self Care | Payer: PRIVATE HEALTH INSURANCE | Attending: Internal Medicine | Admitting: Internal Medicine

## 2020-02-28 DIAGNOSIS — J301 Allergic rhinitis due to pollen: Secondary | ICD-10-CM | POA: Diagnosis not present

## 2020-02-28 DIAGNOSIS — H6591 Unspecified nonsuppurative otitis media, right ear: Secondary | ICD-10-CM

## 2020-02-28 MED ORDER — MUCINEX 600 MG PO TB12
600.0000 mg | ORAL_TABLET | Freq: Two times a day (BID) | ORAL | 0 refills | Status: AC
Start: 2020-02-28 — End: 2020-03-14

## 2020-02-28 MED ORDER — IPRATROPIUM BROMIDE 0.03 % NA SOLN: 2.0000 | Freq: Two times a day (BID) | NASAL | 12 refills | Status: DC

## 2020-02-28 NOTE — ED Triage Notes (Signed)
Patient states that she has been having sinus pain and pressure and ear pressure. States that she was seen for this 3 weeks ago and treated for an ear infection. Patient states that she is going to have tubes placed in her ears soon. Feels like the Augmentin Rx did not totally clear up her symptoms.

## 2020-02-28 NOTE — ED Provider Notes (Signed)
[facility]    CSN: [id number] Arrival date & time: 02/28/20  6962      History   Chief Complaint Chief Complaint  Patient presents with  . Facial Pain    HPI Margaret Bryan is a 59 y.o. female with a history of seasonal allergic rhinitis comes to urgent care with complaint of nasal congestion, right ear fullness, occasional cough with yellowish/greenish sputum and decreased hearing out of the right ear.  Patient was seen here and treated with Augmentin for otitis media bilateral.  She is completed a course of the antibiotics and continues to have nasal congestion with ear fullness symptoms.  No nausea or vomiting.  No fever or chills.  No body aches.  Patient is fully vaccinated against COVID-19 virus.  No sick contacts.Marland Kitchen   HPI  Past Medical History:  Diagnosis Date  . GERD (gastroesophageal reflux disease)   . Heart murmur   . Migraine   . Pancreas divisum     Patient Active Problem List   Diagnosis Date Noted  . History of pancreatitis   . Migraine   . Chest pain 01/07/2020  . H/O: hysterectomy 03/22/2018    Past Surgical History:  Procedure Laterality Date  . ABDOMINAL HYSTERECTOMY    . NASAL SINUS SURGERY      OB History    Gravida  3   Para  3   Term      Preterm      AB      Living        SAB      TAB      Ectopic      Multiple      Live Births               Home Medications    Prior to Admission medications   Medication Sig Start Date End Date Taking? Authorizing Provider  eletriptan (RELPAX) 40 MG tablet Take 40 mg by mouth as needed for migraine or headache. May repeat in 2 hours if headache persists or recurs.    Yes [provider]  esomeprazole (NEXIUM) 40 MG capsule Take 1 capsule (40 mg total) by mouth daily. 07/01/18  Yes Melynda Ripple, MD  fexofenadine (ALLEGRA) 180 MG tablet Take 180 mg by mouth daily.    Yes [provider]  fluticasone (FLONASE) 50 MCG/ACT nasal spray Place 2 sprays  into both nostrils daily as needed for allergies or rhinitis.    Yes [provider]  JUBLIA 10 % SOLN Apply 1 application topically as needed (recurring nail fungus).  11/13/19  Yes [provider]  Magnesium Oxide 250 MG TABS Take 250 mg by mouth daily.    Yes [provider]  Multiple Vitamins-Minerals (ONE-A-DAY 50 PLUS PO) Take 1 tablet by mouth daily.    Yes [provider]  nitroGLYCERIN (NITROSTAT) 0.4 MG SL tablet Place 1 tablet (0.4 mg total) under the tongue every 5 (five) minutes as needed for chest pain. 01/08/20  Yes Nolberto Hanlon, MD  ondansetron (ZOFRAN-ODT) 8 MG disintegrating tablet Take 8 mg by mouth every 8 (eight) hours as needed for nausea or vomiting.   Yes [provider]  PAPAYA PO Take by mouth. Prn for pancreas   Yes [provider]  ranolazine (RANEXA) 500 MG 12 hr tablet Take 1 tablet (500 mg total) by mouth 2 (two) times daily. 02/20/20  Yes Minna Merritts, MD  topiramate (TOPAMAX) 100 MG tablet Take 100 mg by mouth  2 (two) times daily.   Yes [provider]  guaiFENesin (MUCINEX) 600 MG 12 hr tablet Take 1 tablet (600 mg total) by mouth 2 (two) times daily for 15 days. 02/28/20 03/14/20  Chase Picket, MD  ipratropium (ATROVENT) 0.03 % nasal spray Place 2 sprays into both nostrils every 12 (twelve) hours. 02/28/20   Chase Picket, MD  amLODipine (NORVASC) 2.5 MG tablet Take 1 tablet (2.5 mg total) by mouth daily. 01/26/20 02/28/20  Loel Dubonnet, NP    Family History Family History  Problem Relation Age of Onset  . Heart failure Mother   . Hypertension Mother   . Esophageal cancer Mother   . Heart failure Father   . Hypertension Father   . Breast cancer Maternal Aunt 35  . Breast cancer Maternal Grandmother 60  . Breast cancer Cousin 20    Social History Social History   Tobacco Use  . Smoking status: Never Smoker  . Smokeless tobacco: Never Used  Vaping Use  . Vaping Use: Never used    Substance Use Topics  . Alcohol use: No  . Drug use: Never     Allergies   Imitrex [sumatriptan] and Percocet [oxycodone-acetaminophen]   Review of Systems Review of Systems  Constitutional: Positive for activity change. Negative for chills, fatigue and fever.  HENT: Positive for congestion, ear pain, postnasal drip, sinus pressure and sinus pain. Negative for ear discharge, mouth sores, sore throat, trouble swallowing and voice change.   Respiratory: Negative.   Cardiovascular: Negative.   Genitourinary: Negative.   Musculoskeletal: Negative.      Physical Exam Triage Vital Signs ED Triage Vitals  Enc Vitals Group     BP 02/28/20 0859 131/77     Pulse Rate 02/28/20 0859 64     Resp 02/28/20 0859 16     Temp 02/28/20 0859 98.4 F (36.9 C)     Temp Source 02/28/20 0859 Oral     SpO2 02/28/20 0859 100 %     Weight 02/28/20 0856 195 lb 1.7 oz (88.5 kg)     Height 02/28/20 0856 5\' 8"  (1.727 m)     Head Circumference --      Peak Flow --      Pain Score 02/28/20 0856 3     Pain Loc --      Pain Edu? --      Excl. in [place]? --    No data found.  Updated Vital Signs BP 131/77 (BP Location: Right Arm)   Pulse 64   Temp 98.4 F (36.9 C) (Oral)   Resp 16   Ht 5\' 8"  (1.727 m)   Wt 88.5 kg   SpO2 100%   BMI 29.67 kg/m   Visual Acuity Right Eye Distance:   Left Eye Distance:   Bilateral Distance:    Right Eye Near:   Left Eye Near:    Bilateral Near:     Physical Exam Vitals and nursing note reviewed.  Constitutional:      General: She is not in acute distress.    Appearance: Normal appearance. She is not ill-appearing or toxic-appearing.  HENT:     Ears:     Comments: Middle ear effusion on the right ear.  Tympanic membranes are not erythematous.  External ear canal is without erythema or ulcerations or discharge.    Mouth/Throat:     Mouth: Mucous membranes are moist.     Pharynx: Posterior oropharyngeal erythema present.  Eyes:  Extraocular  Movements: Extraocular movements intact.     Conjunctiva/sclera: Conjunctivae normal.     Pupils: Pupils are equal, round, and reactive to light.  Cardiovascular:     Rate and Rhythm: Normal rate and regular rhythm.  Pulmonary:     Effort: Pulmonary effort is normal.     Breath sounds: Normal breath sounds. No wheezing or rhonchi.  Abdominal:     General: Bowel sounds are normal.     Palpations: Abdomen is soft.  Neurological:     Mental Status: She is alert.      UC Treatments / Results  Labs (all labs ordered are listed, but only abnormal results are displayed) Labs Reviewed - No data to display  EKG   Radiology No results found.  Procedures Procedures (including critical care time)  Medications Ordered in UC Medications - No data to display  Initial Impression / Assessment and Plan / UC Course  I have reviewed the triage vital signs and the nursing notes.  Pertinent labs & imaging results that were available during my care of the patient were reviewed by me and considered in my medical decision making (see chart for details).     1.  Allergic rhinitis with middle ear effusions: Mucinex tablets twice daily Add ipratropium to fluticasone nasal spray to help with nasal congestion No indication for antibiotics at this time Return precautions given If patient's symptoms gets worse she is advised to return to the urgent care to be reevaluated.  Final Clinical Impressions(s) / UC Diagnoses   Final diagnoses:  Non-seasonal allergic rhinitis due to pollen  Middle ear effusion, right   Discharge Instructions   None    ED Prescriptions    Medication Sig Dispense Auth. Provider   guaiFENesin (MUCINEX) 600 MG 12 hr tablet Take 1 tablet (600 mg total) by mouth 2 (two) times daily for 15 days. 30 tablet Jaylon Boylen, Myrene Galas, MD   ipratropium (ATROVENT) 0.03 % nasal spray Place 2 sprays into both nostrils every 12 (twelve) hours. 30 mL Lamica Mccart, Myrene Galas, MD     PDMP  not reviewed this encounter.   Chase Picket, MD 02/28/20 [phone]

## 2020-04-09 ENCOUNTER — Other Ambulatory Visit: Payer: Self-pay | Admitting: Family Medicine

## 2020-04-09 DIAGNOSIS — Z1231 Encounter for screening mammogram for malignant neoplasm of breast: Secondary | ICD-10-CM

## 2020-07-26 ENCOUNTER — Other Ambulatory Visit: Payer: Self-pay

## 2020-07-26 ENCOUNTER — Ambulatory Visit
Admission: RE | Admit: 2020-07-26 | Discharge: 2020-07-26 | Disposition: A | Discharge status: Home / Self Care | Payer: PRIVATE HEALTH INSURANCE | Source: Ambulatory Visit | Attending: Family Medicine | Admitting: Family Medicine

## 2020-07-26 DIAGNOSIS — Z1231 Encounter for screening mammogram for malignant neoplasm of breast: Secondary | ICD-10-CM | POA: Insufficient documentation

## 2020-07-26 HISTORY — DX: Malignant (primary) neoplasm, unspecified: C80.1

## 2020-08-02 ENCOUNTER — Other Ambulatory Visit (HOSPITAL_COMMUNITY): Payer: Self-pay | Admitting: Gastroenterology

## 2020-08-02 ENCOUNTER — Other Ambulatory Visit: Payer: Self-pay | Admitting: Gastroenterology

## 2020-08-02 DIAGNOSIS — R1032 Left lower quadrant pain: Secondary | ICD-10-CM

## 2020-08-03 ENCOUNTER — Ambulatory Visit: Payer: PRIVATE HEALTH INSURANCE

## 2020-08-04 ENCOUNTER — Ambulatory Visit
Admission: RE | Admit: 2020-08-04 | Discharge: 2020-08-04 | Disposition: A | Discharge status: Home / Self Care | Payer: PRIVATE HEALTH INSURANCE | Source: Ambulatory Visit | Attending: Gastroenterology | Admitting: Gastroenterology

## 2020-08-04 ENCOUNTER — Other Ambulatory Visit: Payer: Self-pay

## 2020-08-04 DIAGNOSIS — R1032 Left lower quadrant pain: Secondary | ICD-10-CM | POA: Diagnosis present

## 2020-08-04 MED ORDER — IOHEXOL 300 MG/ML  SOLN
100.0000 mL | Freq: Once | INTRAMUSCULAR | Status: AC | PRN
  Administered 2020-08-04: 100 mL via INTRAVENOUS

## 2020-09-01 ENCOUNTER — Other Ambulatory Visit: Payer: Self-pay

## 2020-09-01 ENCOUNTER — Ambulatory Visit
Admission: EM | Admit: 2020-09-01 | Discharge: 2020-09-01 | Disposition: A | Discharge status: Home / Self Care | Payer: PRIVATE HEALTH INSURANCE

## 2020-09-01 DIAGNOSIS — H6991 Unspecified Eustachian tube disorder, right ear: Secondary | ICD-10-CM

## 2020-09-01 DIAGNOSIS — Z20822 Contact with and (suspected) exposure to covid-19: Secondary | ICD-10-CM

## 2020-09-01 DIAGNOSIS — K859 Acute pancreatitis without necrosis or infection, unspecified: Secondary | ICD-10-CM

## 2020-09-01 DIAGNOSIS — H6981 Other specified disorders of Eustachian tube, right ear: Secondary | ICD-10-CM

## 2020-09-01 MED ORDER — ONDANSETRON 4 MG PO TBDP: 4.0000 mg | ORAL_TABLET | Freq: Three times a day (TID) | ORAL | 0 refills | Status: DC | PRN

## 2020-09-01 MED ORDER — PREDNISONE 10 MG PO TABS: 40.0000 mg | ORAL_TABLET | Freq: Every day | ORAL | 0 refills | Status: AC

## 2020-09-01 NOTE — Discharge Instructions (Addendum)
Zofran as needed for nausea, vomiting.  You can take your pain medicine at home as needed if your pain becomes severe. Drawing some blood work here today Covid test pending. Prednisone daily for the next 5 days for eustachian tube dysfunction.  Flonase daily.  If your symptoms worsen by Friday please follow-up

## 2020-09-01 NOTE — ED Triage Notes (Signed)
Pt presents with R ear symptoms, described as a beating heart and crackling sound intermittent x 6 days.  States she has these s/s with ear infections.    Also concerned that she is having a flare up of pancreatitis.  (Has pancreas divisum)  Reports upper abdominal pain x 3-4 days.  Nausea but no vomiting.  No diarrhea.   Pt was exposed to [place] Saturday.  Requests testing.

## 2020-09-02 LAB — COMPREHENSIVE METABOLIC PANEL

## 2020-09-02 LAB — LIPASE

## 2020-09-02 LAB — CBC WITH DIFFERENTIAL/PLATELET

## 2020-09-02 LAB — AMYLASE

## 2020-09-02 LAB — NOVEL CORONAVIRUS, NAA

## 2020-09-02 NOTE — ED Provider Notes (Signed)
[facility]    CSN: [id number] Arrival date & time: 09/01/20  1142      History   Chief Complaint Chief Complaint  Patient presents with   Otalgia   Pancreatitis    HPI Zyleigh Zeitlin is a 59 y.o. female.   Patient is a 59 year old female with past medical history of cancer, GERD, heart murmur, migraine, pancreatitis.  She presents today with right ear discomfort, cracking sound for the past 6 days.  History of similar in the past with ear infections.  No nasal congestion, rhinorrhea or cough.  No fevers. She is also concerned of possible pancreatitis.  History of similar.  She has had upper abdominal pain for the past 3 to 4 days with nausea.  No vomiting or diarrhea.  She was also exposed to Covid on Saturday and request testing.      Past Medical History:  Diagnosis Date   Cancer ([place])    skin ca   GERD (gastroesophageal reflux disease)    Heart murmur    Migraine    Pancreas divisum     Patient Active Problem List   Diagnosis Date Noted   History of pancreatitis    Migraine    Chest pain 01/07/2020   H/O: hysterectomy 03/22/2018    Past Surgical History:  Procedure Laterality Date   ABDOMINAL HYSTERECTOMY     NASAL SINUS SURGERY      OB History    Gravida  3   Para  3   Term      Preterm      AB      Living        SAB      IAB      Ectopic      Multiple      Live Births               Home Medications    Prior to Admission medications   Medication Sig Start Date End Date Taking? Authorizing Provider  eletriptan (RELPAX) 40 MG tablet Take 40 mg by mouth as needed for migraine or headache. May repeat in 2 hours if headache persists or recurs.   Yes [provider]  esomeprazole (NEXIUM) 40 MG capsule Take 1 capsule (40 mg total) by mouth daily. 07/01/18  Yes Melynda Ripple, MD  fexofenadine (ALLEGRA) 180 MG tablet Take 180 mg by mouth daily.   Yes [provider]  fluticasone (FLONASE)  50 MCG/ACT nasal spray Place 2 sprays into both nostrils daily as needed for allergies or rhinitis.    Yes [provider]  ipratropium (ATROVENT) 0.03 % nasal spray Place 2 sprays into both nostrils every 12 (twelve) hours. 02/28/20  Yes Lamptey, Myrene Galas, MD  JUBLIA 10 % SOLN Apply 1 application topically as needed (recurring nail fungus).  11/13/19  Yes [provider]  Magnesium Oxide 250 MG TABS Take 250 mg by mouth daily.    Yes [provider]  Multiple Vitamins-Minerals (ZINC PO) Take by mouth.   Yes [provider]  nitroGLYCERIN (NITROSTAT) 0.4 MG SL tablet Place 1 tablet (0.4 mg total) under the tongue every 5 (five) minutes as needed for chest pain. 01/08/20  Yes Nolberto Hanlon, MD  ondansetron (ZOFRAN-ODT) 8 MG disintegrating tablet Take 8 mg by mouth every 8 (eight) hours as needed for nausea or vomiting.   Yes [provider]  PAPAYA PO Take by mouth. Prn for pancreas   Yes [provider]  ranolazine (RANEXA)  500 MG 12 hr tablet Take 1 tablet (500 mg total) by mouth 2 (two) times daily. 02/20/20  Yes Minna Merritts, MD  topiramate (TOPAMAX) 100 MG tablet Take 100 mg by mouth 2 (two) times daily.   Yes [provider]  VITAMIN D, CHOLECALCIFEROL, PO Take by mouth.   Yes [provider]  Multiple Vitamins-Minerals (ONE-A-DAY 50 PLUS PO) Take 1 tablet by mouth daily.     [provider]  ondansetron (ZOFRAN ODT) 4 MG disintegrating tablet Take 1 tablet (4 mg total) by mouth every 8 (eight) hours as needed for nausea or vomiting. 09/01/20   Loura Halt A, NP  predniSONE (DELTASONE) 10 MG tablet Take 4 tablets (40 mg total) by mouth daily for 5 days. 09/01/20 09/06/20  Loura Halt A, NP  amLODipine (NORVASC) 2.5 MG tablet Take 1 tablet (2.5 mg total) by mouth daily. 01/26/20 02/28/20  Loel Dubonnet, NP    Family History Family History  Problem Relation Age of Onset   Heart failure Mother    Hypertension  Mother    Esophageal cancer Mother    Heart failure Father    Hypertension Father    Breast cancer Maternal Aunt 35   Breast cancer Maternal Grandmother 32   Breast cancer Cousin 20    Social History Social History   Tobacco Use   Smoking status: Never Smoker   Smokeless tobacco: Never Used  Scientific laboratory technician Use: Never used  Substance Use Topics   Alcohol use: No   Drug use: Never     Allergies   Imitrex [sumatriptan] and Percocet [oxycodone-acetaminophen]   Review of Systems Review of Systems   Physical Exam Triage Vital Signs ED Triage Vitals  Enc Vitals Group     BP 09/01/20 1224 (!) 145/89     Pulse Rate 09/01/20 1224 72     Resp 09/01/20 1224 16     Temp 09/01/20 1224 98.6 F (37 C)     Temp Source 09/01/20 1224 Oral     SpO2 09/01/20 1224 99 %     Weight --      Height --      Head Circumference --      Peak Flow --      Pain Score 09/01/20 1301 5     Pain Loc --      Pain Edu? --      Excl. in [place]? --    No data found.  Updated Vital Signs BP (!) 145/89 (BP Location: Left Arm)    Pulse 72    Temp 98.6 F (37 C) (Oral)    Resp 16    SpO2 99%   Visual Acuity Right Eye Distance:   Left Eye Distance:   Bilateral Distance:    Right Eye Near:   Left Eye Near:    Bilateral Near:     Physical Exam Vitals and nursing note reviewed.  Constitutional:      General: She is not in acute distress.    Appearance: Normal appearance. She is not ill-appearing, toxic-appearing or diaphoretic.  HENT:     Head: Normocephalic.     Right Ear: Ear canal normal.     Left Ear: Ear canal normal.     Ears:     Comments: Right ear effusion Left ear TM opaque.     Nose: Nose normal.     Mouth/Throat:     Pharynx: Oropharynx is clear.  Eyes:     Conjunctiva/sclera:  Conjunctivae normal.  Cardiovascular:     Rate and Rhythm: Normal rate and regular rhythm.  Pulmonary:     Effort: Pulmonary effort is normal.     Breath sounds: Normal breath  sounds.  Abdominal:     General: Abdomen is flat. Bowel sounds are normal.     Palpations: Abdomen is soft.     Tenderness: There is abdominal tenderness in the epigastric area and left upper quadrant.    Musculoskeletal:        General: Normal range of motion.     Cervical back: Normal range of motion.  Skin:    General: Skin is warm and dry.     Findings: No rash.  Neurological:     Mental Status: She is alert.  Psychiatric:        Mood and Affect: Mood normal.      UC Treatments / Results  Labs (all labs ordered are listed, but only abnormal results are displayed) Labs Reviewed  NOVEL CORONAVIRUS, NAA  CBC WITH DIFFERENTIAL/PLATELET  COMPREHENSIVE METABOLIC PANEL  AMYLASE  LIPASE    EKG   Radiology No results found.  Procedures Procedures (including critical care time)  Medications Ordered in UC Medications - No data to display  Initial Impression / Assessment and Plan / UC Course  I have reviewed the triage vital signs and the nursing notes.  Pertinent labs & imaging results that were available during my care of the patient were reviewed by me and considered in my medical decision making (see chart for details).     Acute pancreatitis Patient has history of similar. Prescribe Zofran for nausea as needed.  Patient has pain medicine at home she can take as needed Blood work pending  Covid exposure Covid test pending  Eustachian tube dysfunction She with prednisone daily for 4 days Recommend Flonase daily Follow up as needed for continued or worsening symptoms  Final Clinical Impressions(s) / UC Diagnoses   Final diagnoses:  Acute pancreatitis, unspecified complication status, unspecified pancreatitis type  Dysfunction of right eustachian tube  Exposure to COVID-19 virus     Discharge Instructions     Zofran as needed for nausea, vomiting.  You can take your pain medicine at home as needed if your pain becomes severe. Drawing some blood  work here today Covid test pending. Prednisone daily for the next 5 days for eustachian tube dysfunction.  Flonase daily.  If your symptoms worsen by Friday please follow-up    ED Prescriptions    Medication Sig Dispense Auth. Provider   ondansetron (ZOFRAN ODT) 4 MG disintegrating tablet Take 1 tablet (4 mg total) by mouth every 8 (eight) hours as needed for nausea or vomiting. 20 tablet Tally Mckinnon A, NP   predniSONE (DELTASONE) 10 MG tablet Take 4 tablets (40 mg total) by mouth daily for 5 days. 20 tablet Loura Halt A, NP     PDMP not reviewed this encounter.   Orvan July, NP 09/02/20 1029

## 2020-09-05 LAB — SARS-COV-2, NAA 2 DAY TAT

## 2020-09-05 LAB — NOVEL CORONAVIRUS, NAA: SARS-CoV-2, NAA: NOT DETECTED

## 2020-09-05 LAB — SPECIMEN STATUS REPORT

## 2020-09-06 ENCOUNTER — Telehealth (HOSPITAL_COMMUNITY): Payer: Self-pay | Admitting: Emergency Medicine

## 2020-09-06 LAB — COMPREHENSIVE METABOLIC PANEL
ALT: 23 IU/L (ref 0–32)
AST: 22 IU/L (ref 0–40)
Albumin/Globulin Ratio: 2.2 (ref 1.2–2.2)
Albumin: 4.4 g/dL (ref 3.8–4.9)
Alkaline Phosphatase: 115 IU/L (ref 44–121)
BUN/Creatinine Ratio: 11 (ref 9–23)
BUN: 9 mg/dL (ref 6–24)
Bilirubin Total: <0.2 mg/dL (ref 0.0–1.2)
CO2: 20 mmol/L (ref 20–29)
Calcium: 9.1 mg/dL (ref 8.7–10.2)
Chloride: 109 mmol/L — ABNORMAL HIGH (ref 96–106)
Creatinine, Ser: 0.85 mg/dL (ref 0.57–1.00)
GFR calc Af Amer: 88 mL/min/{1.73_m2} (ref >59)
GFR calc non Af Amer: 76 mL/min/{1.73_m2} (ref >59)
Globulin, Total: 2 g/dL (ref 1.5–4.5)
Glucose: 76 mg/dL (ref 65–99)
Potassium: 4.4 mmol/L (ref 3.5–5.2)
Sodium: 144 mmol/L (ref 134–144)
Total Protein: 6.4 g/dL (ref 6.0–8.5)

## 2020-09-06 LAB — CBC WITH DIFFERENTIAL/PLATELET
Basophils Absolute: 0 10*3/uL (ref 0.0–0.2)
Basos: 1 %
EOS (ABSOLUTE): 0.1 10*3/uL (ref 0.0–0.4)
Eos: 2 %
Hematocrit: 41.8 % (ref 34.0–46.6)
Hemoglobin: 13.7 g/dL (ref 11.1–15.9)
Immature Grans (Abs): 0 10*3/uL (ref 0.0–0.1)
Immature Granulocytes: 0 %
Lymphocytes Absolute: 1.9 10*3/uL (ref 0.7–3.1)
Lymphs: 42 %
MCH: 29.7 pg (ref 26.6–33.0)
MCHC: 32.8 g/dL (ref 31.5–35.7)
MCV: 91 fL (ref 79–97)
Monocytes Absolute: 0.4 10*3/uL (ref 0.1–0.9)
Monocytes: 8 %
Neutrophils Absolute: 2.1 10*3/uL (ref 1.4–7.0)
Neutrophils: 47 %
Platelets: 260 10*3/uL (ref 150–450)
RBC: 4.62 x10E6/uL (ref 3.77–5.28)
RDW: 12 % (ref 11.7–15.4)
WBC: 4.5 10*3/uL (ref 3.4–10.8)

## 2020-09-06 LAB — LIPASE: Lipase: 808 U/L — ABNORMAL HIGH (ref 14–72)

## 2020-09-06 LAB — AMYLASE: Amylase: 318 U/L — ABNORMAL HIGH (ref 31–110)

## 2020-09-06 LAB — SPECIMEN STATUS REPORT

## 2020-09-06 MED ORDER — AMOXICILLIN-POT CLAVULANATE 875-125 MG PO TABS: 1.0000 | ORAL_TABLET | Freq: Two times a day (BID) | ORAL | 0 refills | Status: DC

## 2020-09-06 NOTE — Telephone Encounter (Signed)
Called [facility] to check in on results of blood work at patient's request.  Representative Victorino Dike state she will check in to see why the Lipase has not resulted at this time.  Other results are not crossing into Epic due to lab number being changed.

## 2020-12-10 ENCOUNTER — Encounter: Payer: PRIVATE HEALTH INSURANCE | Admitting: Certified Nurse Midwife

## 2020-12-16 ENCOUNTER — Encounter: Payer: PRIVATE HEALTH INSURANCE | Admitting: Certified Nurse Midwife

## 2021-02-03 ENCOUNTER — Encounter: Payer: Self-pay | Admitting: Certified Nurse Midwife

## 2021-02-03 ENCOUNTER — Ambulatory Visit (INDEPENDENT_AMBULATORY_CARE_PROVIDER_SITE_OTHER): Payer: PRIVATE HEALTH INSURANCE | Admitting: Certified Nurse Midwife

## 2021-02-03 ENCOUNTER — Other Ambulatory Visit: Payer: Self-pay

## 2021-02-03 VITALS — BP 130/82 | HR 77 | Resp 16 | Ht 68.0 in | Wt 202.4 lb

## 2021-02-03 DIAGNOSIS — R6882 Decreased libido: Secondary | ICD-10-CM | POA: Diagnosis not present

## 2021-02-03 DIAGNOSIS — Z01419 Encounter for gynecological examination (general) (routine) without abnormal findings: Secondary | ICD-10-CM | POA: Diagnosis not present

## 2021-02-03 DIAGNOSIS — Z1231 Encounter for screening mammogram for malignant neoplasm of breast: Secondary | ICD-10-CM | POA: Diagnosis not present

## 2021-02-03 NOTE — Patient Instructions (Addendum)
Preventive Care 40-58 Years Old, Female Preventive care refers to lifestyle choices and visits with your health care provider that can promote health and wellness. This includes:  A yearly physical exam. This is also called an annual wellness visit.  Regular dental and eye exams.  Immunizations.  Screening for certain conditions.  Healthy lifestyle choices, such as: ? Eating a healthy diet. ? Getting regular exercise. ? Not using drugs or products that contain nicotine and tobacco. ? Limiting alcohol use. What can I expect for my preventive care visit? Physical exam Your health care provider will check your:  Height and weight. These may be used to calculate your BMI (body mass index). BMI is a measurement that tells if you are at a healthy weight.  Heart rate and blood pressure.  Body temperature.  Skin for abnormal spots. Counseling Your health care provider may ask you questions about your:  Past medical problems.  Family's medical history.  Alcohol, tobacco, and drug use.  Emotional well-being.  Home life and relationship well-being.  Sexual activity.  Diet, exercise, and sleep habits.  Work and work environment.  Access to firearms.  Method of birth control.  Menstrual cycle.  Pregnancy history. What immunizations do I need? Vaccines are usually given at various ages, according to a schedule. Your health care provider will recommend vaccines for you based on your age, medical history, and lifestyle or other factors, such as travel or where you work.   What tests do I need? Blood tests  Lipid and cholesterol levels. These may be checked every 5 years, or more often if you are over 50 years old.  Hepatitis C test.  Hepatitis B test. Screening  Lung cancer screening. You may have this screening every year starting at age 55 if you have a 30-pack-year history of smoking and currently smoke or have quit within the past 15 years.  Colorectal cancer  screening. ? All adults should have this screening starting at age 50 and continuing until age 75. ? Your health care provider may recommend screening at age 45 if you are at increased risk. ? You will have tests every 1-10 years, depending on your results and the type of screening test.  Diabetes screening. ? This is done by checking your blood sugar (glucose) after you have not eaten for a while (fasting). ? You may have this done every 1-3 years.  Mammogram. ? This may be done every 1-2 years. ? Talk with your health care provider about when you should start having regular mammograms. This may depend on whether you have a family history of breast cancer.  BRCA-related cancer screening. This may be done if you have a family history of breast, ovarian, tubal, or peritoneal cancers.  Pelvic exam and Pap test. ? This may be done every 3 years starting at age 21. ? Starting at age 30, this may be done every 5 years if you have a Pap test in combination with an HPV test. Other tests  STD (sexually transmitted disease) testing, if you are at risk.  Bone density scan. This is done to screen for osteoporosis. You may have this scan if you are at high risk for osteoporosis. Talk with your health care provider about your test results, treatment options, and if necessary, the need for more tests. Follow these instructions at home: Eating and drinking  Eat a diet that includes fresh fruits and vegetables, whole grains, lean protein, and low-fat dairy products.  Take vitamin and mineral supplements   as recommended by your health care provider.  Do not drink alcohol if: ? Your health care provider tells you not to drink. ? You are pregnant, may be pregnant, or are planning to become pregnant.  If you drink alcohol: ? Limit how much you have to 0-1 drink a day. ? Be aware of how much alcohol is in your drink. In the [place] one drink equals one 12 oz bottle of beer (355 mL), one 5 oz glass of  wine (148 mL), or one 1 oz glass of hard liquor (44 mL).   Lifestyle  Take daily care of your teeth and gums. Brush your teeth every morning and night with fluoride toothpaste. Floss one time each day.  Stay active. Exercise for at least 30 minutes 5 or more days each week.  Do not use any products that contain nicotine or tobacco, such as cigarettes, e-cigarettes, and chewing tobacco. If you need help quitting, ask your health care provider.  Do not use drugs.  If you are sexually active, practice safe sex. Use a condom or other form of protection to prevent STIs (sexually transmitted infections).  If you do not wish to become pregnant, use a form of birth control. If you plan to become pregnant, see your health care provider for a prepregnancy visit.  If told by your health care provider, take low-dose aspirin daily starting at age 50.  Find healthy ways to cope with stress, such as: ? Meditation, yoga, or listening to music. ? Journaling. ? Talking to a trusted person. ? Spending time with friends and family. Safety  Always wear your seat belt while driving or riding in a vehicle.  Do not drive: ? If you have been drinking alcohol. Do not ride with someone who has been drinking. ? When you are tired or distracted. ? While texting.  Wear a helmet and other protective equipment during sports activities.  If you have firearms in your house, make sure you follow all gun safety procedures. What's next?  Visit your health care provider once a year for an annual wellness visit.  Ask your health care provider how often you should have your eyes and teeth checked.  Stay up to date on all vaccines. This information is not intended to replace advice given to you by your health care provider. Make sure you discuss any questions you have with your health care provider. Document Revised: 06/01/2020 Document Reviewed: 05/09/2018 Elsevier Patient Education  2021 [employer]   

## 2021-02-03 NOTE — Progress Notes (Signed)
ANNUAL PREVENTATIVE CARE GYN  ENCOUNTER NOTE  Subjective:       Margaret Bryan is a 60 y.o. G3P3 female here for a routine annual gynecologic exam.  Current complaints: 1.  Low libido  Denies difficulty breathing or respiratory distress, chest pain, abdominal pain, vaginal bleeding, dysuria, and leg pain or swelling.    Gynecologic History  No LMP recorded. Patient has had a hysterectomy.  Contraception: status post hysterectomy  Last Pap: 03/2018. Results were: Neg/Neg  Last mammogram: 07/2020. Results were: BI-RADS 1  Last colonoscopy: Up to date  Obstetric History  OB History  Gravida Para Term Preterm AB Living  3 3          SAB IAB Ectopic Multiple Live Births               # Outcome Date GA Lbr Len/2nd Weight Sex Delivery Anes PTL Lv  3 Para 1995    F Vag-Spont     2 Para 1992    F Vag-Spont     1 Para 1989    F Vag-Spont  N     Past Medical History:  Diagnosis Date  . Cancer ([place])    skin ca  . GERD (gastroesophageal reflux disease)   . Heart murmur   . Migraine   . Pancreas divisum     Past Surgical History:  Procedure Laterality Date  . ABDOMINAL HYSTERECTOMY    . NASAL SINUS SURGERY      Current Outpatient Medications on File Prior to Visit  Medication Sig Dispense Refill  . eletriptan (RELPAX) 40 MG tablet Take 40 mg by mouth as needed for migraine or headache. May repeat in 2 hours if headache persists or recurs.    Marland Kitchen esomeprazole (NEXIUM) 40 MG capsule Take 1 capsule (40 mg total) by mouth daily. 30 capsule 0  . fexofenadine (ALLEGRA) 180 MG tablet Take 180 mg by mouth daily.    . fluticasone (FLONASE) 50 MCG/ACT nasal spray Place 2 sprays into both nostrils daily as needed for allergies or rhinitis.     . Magnesium Oxide 250 MG TABS Take 250 mg by mouth daily.     . montelukast (SINGULAIR) 10 MG tablet Take by mouth.    . Multiple Vitamins-Minerals (ONE-A-DAY 50 PLUS PO) Take 1 tablet by mouth daily.     . nitrofurantoin,  macrocrystal-monohydrate, (MACROBID) 100 MG capsule Take by mouth.    . nitroGLYCERIN (NITROSTAT) 0.4 MG SL tablet Place 1 tablet (0.4 mg total) under the tongue every 5 (five) minutes as needed for chest pain. 30 tablet 0  . ondansetron (ZOFRAN-ODT) 8 MG disintegrating tablet Take 8 mg by mouth every 8 (eight) hours as needed for nausea or vomiting.    Marland Kitchen PAPAYA PO Take by mouth. Prn for pancreas    . ranolazine (RANEXA) 500 MG 12 hr tablet Take 1 tablet (500 mg total) by mouth 2 (two) times daily. 180 tablet 3  . rosuvastatin (CRESTOR) 5 MG tablet Take by mouth.    . thiamine 100 MG tablet Take by mouth.    . topiramate (TOPAMAX) 100 MG tablet Take 100 mg by mouth 2 (two) times daily.    Marland Kitchen zinc sulfate 220 (50 Zn) MG capsule Take by mouth.    . [DISCONTINUED] amLODipine (NORVASC) 2.5 MG tablet Take 1 tablet (2.5 mg total) by mouth daily. 90 tablet 3   No current facility-administered medications on file prior to visit.    Allergies  Allergen Reactions  . Contrast  Media [Iodinated Diagnostic Agents] Other (See Comments)    Strange feeling in mouth, teary eyes, increased blood pressure.  . Imitrex [Sumatriptan]   . Percocet [Oxycodone-Acetaminophen] Other (See Comments)    Hallucinations, rapid heartbeat    Social History   Socioeconomic History  . Marital status: Married    Spouse name: Not on file  . Number of children: Not on file  . Years of education: Not on file  . Highest education level: Not on file  Occupational History  . Not on file  Tobacco Use  . Smoking status: Never Smoker  . Smokeless tobacco: Never Used  Vaping Use  . Vaping Use: Never used  Substance and Sexual Activity  . Alcohol use: No  . Drug use: Never  . Sexual activity: Yes  Other Topics Concern  . Not on file  Social History Narrative  . Not on file   Social Determinants of Health   Financial Resource Strain: Not on file  Food Insecurity: Not on file  Transportation Needs: Not on file   Physical Activity: Not on file  Stress: Not on file  Social Connections: Not on file  Intimate Partner Violence: Not on file    Family History  Problem Relation Age of Onset  . Heart failure Mother   . Hypertension Mother   . Esophageal cancer Mother   . Heart failure Father   . Hypertension Father   . Breast cancer Maternal Aunt 35  . Breast cancer Maternal Grandmother 52  . Breast cancer Cousin 32    The following portions of the patient's history were reviewed and updated as appropriate: allergies, current medications, past family history, past medical history, past social history, past surgical history and problem list.  Review of Systems  ROS negative except as noted above. Information obtained from patient.    Objective:   BP 130/82   Pulse 77   Resp 16   Ht 5\' 8"  (1.727 m)   Wt 202 lb 6.4 oz (91.8 kg)   BMI 30.77 kg/m    CONSTITUTIONAL: Well-developed, well-nourished female in no acute distress.   PSYCHIATRIC: Normal mood and affect. Normal behavior. Normal judgment and thought content.  Laureles: Alert and oriented to person, place, and time. Normal muscle tone coordination. No cranial nerve deficit noted.  HENT:  Normocephalic, atraumatic.  EYES: Conjunctivae and EOM are normal.   NECK: Normal range of motion, supple, no masses.  Normal thyroid.   SKIN: Skin is warm and dry. No rash noted. Not diaphoretic. No erythema. No pallor.  CARDIOVASCULAR: Normal heart rate noted, regular rhythm, no murmur.  RESPIRATORY: Clear to auscultation bilaterally. Effort and breath sounds normal, no problems with respiration noted.  BREASTS: Symmetric in size. No masses, skin changes, nipple drainage, or lymphadenopathy.  ABDOMEN: Soft, normal bowel sounds, no distention noted.  No tenderness, rebound or guarding.   PELVIC:  External Genitalia: Normal  Cervix: Normal  MUSCULOSKELETAL: Normal range of motion. No tenderness.  No cyanosis, clubbing, or edema.  2+  distal pulses.  LYMPHATIC: No Axillary, Supraclavicular, or Inguinal Adenopathy.  Assessment:   Annual gynecologic examination 60 y.o.   Contraception: status post hysterectomy   Obesity 1   Problem List Items Addressed This Visit   None   Visit Diagnoses    Well woman exam    -  Primary   Screening mammogram for breast cancer          Plan:   Pap: Not needed   Mammogram: Managed by PCP  Labs: See orders  Routine preventative health maintenance measures emphasized: Exercise/Diet/Weight control, Tobacco Warnings, Alcohol/Substance use risks and Stress Management; see AVS  Reviewed red flag symptoms and when to call  Return to [place] for [employer] or sooner if needed   Dani Gobble, CNM  [facility], [facility] 02/03/21 9:15 AM

## 2021-02-04 LAB — TSH: TSH: 2.17 u[IU]/mL (ref 0.450–4.500)

## 2021-02-04 LAB — TESTOSTERONE, FREE, TOTAL, SHBG
Sex Hormone Binding: 87.8 nmol/L (ref 17.3–125.0)
Testosterone, Free: 0.2 pg/mL (ref 0.0–4.2)
Testosterone: <3 ng/dL — ABNORMAL LOW (ref 4–50)

## 2021-02-04 LAB — PROGESTERONE: Progesterone: 0.1 ng/mL

## 2021-02-04 LAB — ESTRADIOL: Estradiol: <5 pg/mL

## 2021-02-07 NOTE — Progress Notes (Signed)
Is there a way to add on [facility]. Thanks, JML

## 2021-02-08 NOTE — Addendum Note (Signed)
Addended by: Chilton Greathouse on: 02/08/2021 12:03 PM   Modules accepted: Orders

## 2021-02-10 ENCOUNTER — Telehealth: Payer: Self-pay | Admitting: Cardiovascular Disease

## 2021-02-10 LAB — FOLLICLE STIMULATING HORMONE: FSH: 65.3 m[IU]/mL

## 2021-02-10 LAB — SPECIMEN STATUS REPORT

## 2021-02-10 NOTE — Telephone Encounter (Signed)
Was able to return call to Mrs. Yniguez, she reports over the past 3 weeks she has had swelling to her bilateral feet and ankles, worse to the left. She denies SOB or CP, has been seen by her PCP and they have done labs and evaluation and thinks it may be more cardiac related.  Also reports 10 lbs weight gain over the past 3 months. Recent 3 hour car ride to the beach.  Advised Mrs. Casco she has not been seen in over a year, needs f/u visit for evaluation and possible cardiac studies with medication adjustments, currently not on any fluid medications at this time. PCP has not started her on any recent cardiac medication, remains on Ranexa BID, Nitro PRN, and her cholesterol meds.   I have advised the patient to monitor her swelling and too  - elevate her lower extremities when possible  - use some compression socks   - decrease her salt intake   - long car rides get up and move around at rest areas  -continue to monitor her weights/weight gains  Things to watch out for: -sudden shob -CP -increase swelling/edema, tightening of the skin -pitting edema         If these symptoms develop, then she needs to be seen in the ED for a workup for cardiac related symptoms/fluid overload.   Mrs. Carmical verbalized understanding, very thankful for the return call. Appt made for 6/14 with Cadence PA-C at 8 am.

## 2021-02-10 NOTE — Telephone Encounter (Signed)
Pt c/o swelling: STAT is pt has developed SOB within 24 hours  1. If swelling, where is the swelling located?  Ankles and top of feet worse in left   2. How much weight have you gained and in what time span?  10 lbs last 3 months  3. Have you gained 3 pounds in a day or 5 pounds in a week? No   4. Do you have a log of your daily weights (if so, list)? No   5. Are you currently taking a fluid pill? no  6. Are you currently SOB? No but struggling with heat   7. Have you traveled recently?   Yes 3 hr ride to the beach where currently staying

## 2021-02-21 ENCOUNTER — Other Ambulatory Visit: Payer: Self-pay

## 2021-02-21 NOTE — Progress Notes (Signed)
Cardiology Office Note:    Date:  02/22/2021   ID:  Margaret Bryan, DOB November 04, 1960, MRN [id number]  PCP:  [name], MD  [facility] Cardiologist:  Ida Rogue, MD  [place] Electrophysiologist:  None   Referring MD: [name], MD   Chief Complaint: 1 year follow-up/LLE  History of Present Illness:    Margaret Bryan is a 60 y.o. female with a hx of chronic pancreatitis, HLD, chest pain, family history of CAD, non-smoker who presents for 1 year follow-up.   Seen 01/2020 after hospitalization for chest pain. Work-up in the hospital was negative for ACS. CTA chest ed no PE, no significant coronary calcification, and noted aortic atherosclerosis. She was given Imdur, but had not taken it yet when seen in the hospital. She had persistent atypical chest pain so a coronary CT was ordered. This showed coronary calcium score of 0, low risk for coronary events. No CAD. She was started on low dose amlodipine 2.5mg  daily. She was seen back in follow-up and reported improved symptoms.   Patient called 02/10/21 to report lower leg edema and she was set up for office appointment.   Today, the patient reports 4 weeks of lower leg edema. Swelling improves overnight, gets worse throughout the day as she is on her feet., L>R. In the past week, also the feet tingle, more so in the left. Patient follows low salt diet. Stays hydrated. Has been elevating feet at night. Has been wearing compression socks. Seemed to help a little. Has some shortness of breath on exertion. Has some chronic chest pain, not worse than before. Has some palpitations throughout the day, no triggers, can last up to a minute. No recent fever, chills, illness, nausea, vomiting. No leg pain. Weight up 10 lbs in the last 3 months, although appetite is low. Has some abdominal fullness.   Labs from 5/16: Alb 3.9 Potassium 4.1 Creatinine/BUN 0.8/14  LDL 144 TG 72 HDL 75  Past Medical History:  Diagnosis Date    Cancer ([place])    skin ca   GERD (gastroesophageal reflux disease)    Heart murmur    Migraine    Pancreas divisum     Past Surgical History:  Procedure Laterality Date   ABDOMINAL HYSTERECTOMY     NASAL SINUS SURGERY      Current Medications: Current Meds  Medication Sig   eletriptan (RELPAX) 40 MG tablet Take 40 mg by mouth as needed for migraine or headache. May repeat in 2 hours if headache persists or recurs.   esomeprazole (NEXIUM) 40 MG capsule Take 1 capsule (40 mg total) by mouth daily.   fexofenadine (ALLEGRA) 180 MG tablet Take 180 mg by mouth daily.   fluticasone (FLONASE) 50 MCG/ACT nasal spray Place 2 sprays into both nostrils daily as needed for allergies or rhinitis.    hydrochlorothiazide (MICROZIDE) 12.5 MG capsule Take 1 capsule (12.5 mg total) by mouth daily.   Magnesium Oxide 250 MG TABS Take 250 mg by mouth daily.    montelukast (SINGULAIR) 10 MG tablet Take by mouth.   Multiple Vitamins-Minerals (ONE-A-DAY 50 PLUS PO) Take 1 tablet by mouth daily.    nitrofurantoin, macrocrystal-monohydrate, (MACROBID) 100 MG capsule Take by mouth.   nitroGLYCERIN (NITROSTAT) 0.4 MG SL tablet Place 1 tablet (0.4 mg total) under the tongue every 5 (five) minutes as needed for chest pain.   ondansetron (ZOFRAN-ODT) 8 MG disintegrating tablet Take 8 mg by mouth every 8 (eight) hours as needed for nausea or  vomiting.   PAPAYA PO Take by mouth. Prn for pancreas   ranolazine (RANEXA) 500 MG 12 hr tablet Take 1 tablet (500 mg total) by mouth 2 (two) times daily.   rosuvastatin (CRESTOR) 5 MG tablet Take by mouth.   thiamine 100 MG tablet Take by mouth.   topiramate (TOPAMAX) 100 MG tablet Take 100 mg by mouth 2 (two) times daily.   zinc sulfate 220 (50 Zn) MG capsule Take by mouth.     Allergies:   Iodinated diagnostic agents, Imitrex [sumatriptan], and Percocet [oxycodone-acetaminophen]   Social History   Socioeconomic History   Marital status: Married    Spouse name: Not on  file   Number of children: Not on file   Years of education: Not on file   Highest education level: Not on file  Occupational History   Not on file  Tobacco Use   Smoking status: Never   Smokeless tobacco: Never  Vaping Use   Vaping Use: Never used  Substance and Sexual Activity   Alcohol use: No   Drug use: Never   Sexual activity: Yes  Other Topics Concern   Not on file  Social History Narrative   Not on file   Social Determinants of Health   Financial Resource Strain: Not on file  Food Insecurity: Not on file  Transportation Needs: Not on file  Physical Activity: Not on file  Stress: Not on file  Social Connections: Not on file     Family History: The patient's family history includes Breast cancer (age of onset: 42) in her cousin; Breast cancer (age of onset: 78) in her maternal aunt; Breast cancer (age of onset: 14) in her maternal grandmother; Esophageal cancer in her mother; Heart failure in her father and mother; Hypertension in her father and mother.  ROS:   Please see the history of present illness.     All other systems reviewed and are negative.  EKGs/Labs/Other Studies Reviewed:    The following studies were reviewed today:  Coronary CTA 25/2021 IMPRESSION: 1. Coronary calcium score of 0. Patient is low risk for near term coronary events   2. Normal coronary origin with right dominance.   3. No evidence of CAD.   4.CAD-RADS 0. Consider non-atherosclerotic causes of chest pain.   Electronically Signed: By: Kate Sable M.D. On: 01/22/2020 16:07    EKG:  EKG is  ordered today.  The ekg ordered today demonstrates NSR, 73bpm, no ischemic changes  Recent Labs: 09/01/2020: ALT 23; BUN 9; Creatinine, Ser 0.85; Hemoglobin 13.7; Platelets 260; Potassium 4.4; Sodium 144 02/03/2021: TSH 2.170  Recent Lipid Panel No results found for: CHOL, TRIG, HDL, CHOLHDL, VLDL, LDLCALC, LDLDIRECT  Physical Exam:    VS:  BP 118/70 (BP Location: Left Arm,  Patient Position: Sitting, Cuff Size: Normal)   Pulse 73   Ht 5\' 8"  (1.727 m)   Wt 199 lb 2 oz (90.3 kg)   SpO2 99%   BMI 30.28 kg/m     Wt Readings from Last 3 Encounters:  02/22/21 199 lb 2 oz (90.3 kg)  02/03/21 202 lb 6.4 oz (91.8 kg)  02/28/20 195 lb 1.7 oz (88.5 kg)     GEN: Well nourished, well developed in no acute distress HEENT: Normal NECK: No JVD; No carotid bruits LYMPHATICS: No lymphadenopathy CARDIAC: RRR, no murmurs, rubs, gallops RESPIRATORY:  Clear to auscultation without rales, wheezing or rhonchi  ABDOMEN: Soft, non-tender, non-distended MUSCULOSKELETAL:  1+ lower leg edema, L>R; No deformity  SKIN:  Warm and dry NEUROLOGIC:  Alert and oriented x 3 PSYCHIATRIC:  Normal affect   ASSESSMENT:    1. Leg swelling   2. Chest pain, unspecified type   3. Medication management   4. Essential hypertension   5. Hyperlipidemia, mixed   6. Precordial pain   7. DOE (dyspnea on exertion)    PLAN:    In order of problems listed above:  Lower leg edema Reported symptoms started a month ago, LLE worse at throughout the day and L>R. She has been wearing compression socks, elevating feet, and eating low salt diet, which has seemed to help some. No orthopnea or chest pain. Has some mild DOE and palpitations. No leg pain. EKG with NSR and no significant change. Exam with 1+ LLE on the left and trace on the right, lungs clear, no JVD, no leg pain, good pulses. No prior echo. I will order Left sided DVT study and an echo. I will start HCTZ 12.5mg  daily, BMET in 1 week. We will see her back in 1 month.  Atypical Chest pain Possible coronary spasms Patient has chronic chest pain which is unchanged. Prior coronary CT showed calcium score of 0, no evidence of CAD. Some DOE as above. Continue Ranexa and statin.  Palpitations Intermittent over the last month. EKG unremarkable. Exam benign. Patient want's to wait on heart monitor, can consider at follow-up.  HTN BP wnl.  Addition of  HCTZ as above.   HLD PCP recently started Crestor 5mg  daily. Per PCP.  Disposition: Follow up in 1 month(s) with MD/APP     Signed, Vonna Brabson Ninfa Meeker, PA-C  02/22/2021 9:30 AM    [facility]

## 2021-02-22 ENCOUNTER — Other Ambulatory Visit: Payer: Self-pay

## 2021-02-22 ENCOUNTER — Ambulatory Visit (INDEPENDENT_AMBULATORY_CARE_PROVIDER_SITE_OTHER): Payer: PRIVATE HEALTH INSURANCE

## 2021-02-22 ENCOUNTER — Encounter: Payer: Self-pay | Admitting: Medical

## 2021-02-22 ENCOUNTER — Ambulatory Visit (INDEPENDENT_AMBULATORY_CARE_PROVIDER_SITE_OTHER): Payer: PRIVATE HEALTH INSURANCE | Admitting: Medical

## 2021-02-22 VITALS — BP 118/70 | HR 73 | Ht 68.0 in | Wt 199.1 lb

## 2021-02-22 DIAGNOSIS — Z79899 Other long term (current) drug therapy: Secondary | ICD-10-CM

## 2021-02-22 DIAGNOSIS — R072 Precordial pain: Secondary | ICD-10-CM

## 2021-02-22 DIAGNOSIS — I10 Essential (primary) hypertension: Secondary | ICD-10-CM

## 2021-02-22 DIAGNOSIS — M7989 Other specified soft tissue disorders: Secondary | ICD-10-CM

## 2021-02-22 DIAGNOSIS — E782 Mixed hyperlipidemia: Secondary | ICD-10-CM

## 2021-02-22 DIAGNOSIS — R0600 Dyspnea, unspecified: Secondary | ICD-10-CM

## 2021-02-22 DIAGNOSIS — R079 Chest pain, unspecified: Secondary | ICD-10-CM

## 2021-02-22 DIAGNOSIS — R0609 Other forms of dyspnea: Secondary | ICD-10-CM

## 2021-02-22 MED ORDER — HYDROCHLOROTHIAZIDE 12.5 MG PO CAPS: 12.5000 mg | ORAL_CAPSULE | Freq: Every day | ORAL | 3 refills | Status: DC

## 2021-02-22 NOTE — Patient Instructions (Signed)
Medication Instructions:  START HTCZ (Hydrochlorothiazide) 12.5 mg Daily   *If you need a refill on your cardiac medications before your next appointment, please call your pharmacy*   Lab Work: BMET (in 1 week) Walk into medical mall at the check in desk, they will direct you to lab registration, hours for labs are Monday-Friday 07:00am-5:30pm (no appointment necessary)  Testing/Procedures: Echo Venous ultrasound of left leg  Your physician has requested that you have an echocardiogram. Echocardiography is a painless test that uses sound waves to create images of your heart. It provides your doctor with information about the size and shape of your heart and how well your heart's chambers and valves are working. This procedure takes approximately one hour. There are no restrictions for this procedure.  There is a possibility that an IV may need to be started during your test to inject an image enhancing agent. This is done to obtain more optimal pictures of your heart. Therefore we ask that you do at least drink some water prior to coming in to hydrate your veins.   Follow-Up:  Your next appointment:   1 month(s)  The format for your next appointment:   In Person  Provider:   Cadence Kathlen Mody, PA-C

## 2021-02-23 ENCOUNTER — Ambulatory Visit (INDEPENDENT_AMBULATORY_CARE_PROVIDER_SITE_OTHER): Payer: PRIVATE HEALTH INSURANCE

## 2021-02-23 ENCOUNTER — Telehealth: Payer: Self-pay | Admitting: Cardiovascular Disease

## 2021-02-23 DIAGNOSIS — M7989 Other specified soft tissue disorders: Secondary | ICD-10-CM | POA: Diagnosis not present

## 2021-02-23 DIAGNOSIS — R079 Chest pain, unspecified: Secondary | ICD-10-CM | POA: Diagnosis not present

## 2021-02-23 LAB — ECHOCARDIOGRAM COMPLETE
AR max vel: 2.63 cm2
AV Area VTI: 2.46 cm2
AV Area mean vel: 2.85 cm2
AV Mean grad: 4 mmHg
AV Peak grad: 7.8 mmHg
Ao pk vel: 1.4 m/s
Area-P 1/2: 3.95 cm2
Calc EF: 62.6 %
S' Lateral: 2.8 cm
Single Plane A2C EF: 62.2 %
Single Plane A4C EF: 62.7 %

## 2021-02-23 NOTE — Telephone Encounter (Signed)
I called and spoke with the patient regarding her lower extremity venous duplex results.  She then advised she had left the message below when she was here this morning for her echo.  Per the patient, she went and picked up her HCTZ yesterday and after having paid for it, she was told the pharmacist wanted to speak with her. The pharmacist advised her that HCTZ has the same ingredients as Imitrex to which she has been unable to tolerate in the past.  She also advised that she was told by her neurologist, after having brain scan done, that she should take an ASA 81 mg once daily. She was unclear of the reason why, but did want Dr. Donivan Scull thoughts on this.  I advised her that Dr. Donivan Scull nurse had already forwarded a message to our pharmacy staff about the HCTZ.  She is aware we will call her back with further recommendations, but she will hold off on starting the HCTZ & ASA until she hears back from our office.  To [place] to review as well.

## 2021-02-23 NOTE — Telephone Encounter (Signed)
Imitrex and HCTZ contain sulfa, but are not the same medication nor contain the same ingredients.  Noted patient takes Relpax for migraines , also a medication with sulfa; therefore, she should be able to tolerate HCTZ.  Patient is intolerant to imitrex, but looks like is not allergic to sulfa medication. She should be fine following Dr Rockey Situ recommendations.

## 2021-02-23 NOTE — Telephone Encounter (Signed)
I called and spoke with the patient regarding pharmacy feedback & recommendations for proceeding with HCTZ.  I have also advised the patient we are still awaiting feedback from Dr. Rockey Situ regarding her ASA. I encouraged her to go ahead and start the HCTZ if she is agreeable and we will call her back with Dr. Donivan Scull recommendations regarding ASA.  The patient voices understanding of the above and is agreeable.

## 2021-02-23 NOTE — Telephone Encounter (Signed)
Patient came by office for ECHO States that her pharmacist told her that hydrochlorothiazide has the same ingredients that Imitrex has and would cause the same reactions  Also another provider suggested that she take an aspirin a day and she would like to know if Dr Rockey Situ would agree  Please call to discuss

## 2021-02-26 ENCOUNTER — Other Ambulatory Visit: Payer: Self-pay | Admitting: Cardiovascular Disease

## 2021-03-01 NOTE — Telephone Encounter (Signed)
Was abel to get back in touch with Margaret Bryan regarding ASA daily, advised on Dr. 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  Continue statin"   Will defer  to her whether she would like aspirin statin but her recent cardiac CTA showed no significant disease  TGollan   Advised it is up to her rather she would like to take ASA 81 mg daily, pt reports will "think about it", otherwise she is thankful for Dr. Donivan Scull input, reports still at the beach, will have labs drawn as discuss on 6/14 when she returns from the beach and f/u appt with Cadence Furth, PA-C 7/15.

## 2021-03-09 ENCOUNTER — Other Ambulatory Visit
Admission: RE | Admit: 2021-03-09 | Discharge: 2021-03-09 | Disposition: A | Discharge status: Home / Self Care | Payer: PRIVATE HEALTH INSURANCE | Source: Ambulatory Visit | Attending: Medical | Admitting: Medical

## 2021-03-09 DIAGNOSIS — Z79899 Other long term (current) drug therapy: Secondary | ICD-10-CM

## 2021-03-09 LAB — BASIC METABOLIC PANEL
Anion gap: 12 (ref 5–15)
BUN: 18 mg/dL (ref 6–20)
CO2: 26 mmol/L (ref 22–32)
Calcium: 9.1 mg/dL (ref 8.9–10.3)
Chloride: 95 mmol/L — ABNORMAL LOW (ref 98–111)
Creatinine, Ser: 0.66 mg/dL (ref 0.44–1.00)
GFR, Estimated: >60 mL/min (ref >60)
Glucose, Bld: 105 mg/dL — ABNORMAL HIGH (ref 70–99)
Potassium: 3 mmol/L — ABNORMAL LOW (ref 3.5–5.1)
Sodium: 133 mmol/L — ABNORMAL LOW (ref 135–145)

## 2021-03-11 ENCOUNTER — Telehealth: Payer: Self-pay | Admitting: Medical

## 2021-03-11 DIAGNOSIS — E876 Hypokalemia: Secondary | ICD-10-CM

## 2021-03-11 MED ORDER — POTASSIUM CHLORIDE CRYS ER 20 MEQ PO TBCR: EXTENDED_RELEASE_TABLET | ORAL | 3 refills | Status: DC

## 2021-03-11 NOTE — Telephone Encounter (Signed)
BMP ordered by Tarri Glenn, PA. She is currently out of the office this week. I asked Ignacia Bayley, NP to review the report in her absence as the patient's potassium is low at 3.0 since starting HCTZ 12.5 mg once daily on 02/22/21.  I called the patient with her lab results and have reviewed recommendations as stated by Ignacia Bayley, NP below to: 1) Start potassium 20 meq: - take 2 tablets (40 meq) once daily  2) she is aware she will need to: - take 2 tablets (40 meq) when she picks this up today & take an additional 2 tablets (40 meq) 2 hours later  3) repeat a BMP at the [place] [facility] in 1 week  The patient states she is taking an OTC potassium supplement. I have advised her she can stop this as we can not be certain how much potassium she is truly getting with this.   The patient voices understanding of the above recommendations and is agreeable. I have also sent this to her in writing through her MyChart.

## 2021-03-11 NOTE — Telephone Encounter (Signed)
Margaret Gianotti, NP  03/11/2021 12:29 PM EDT      K is low @ 3.0 since starting HCTZ.  I recommend adding Kdur 59meq daily and I'd like her to take two doses today, preferably at least 2 hours apart. F/uBMET next week (under Cadence pls).

## 2021-03-18 ENCOUNTER — Other Ambulatory Visit
Admission: RE | Admit: 2021-03-18 | Discharge: 2021-03-18 | Disposition: A | Discharge status: Home / Self Care | Payer: BC Managed Care – PPO | Source: Ambulatory Visit | Attending: Medical | Admitting: Medical

## 2021-03-18 DIAGNOSIS — E876 Hypokalemia: Secondary | ICD-10-CM | POA: Diagnosis present

## 2021-03-18 LAB — BASIC METABOLIC PANEL
Anion gap: 5 (ref 5–15)
BUN: 20 mg/dL (ref 6–20)
CO2: 25 mmol/L (ref 22–32)
Calcium: 8.6 mg/dL — ABNORMAL LOW (ref 8.9–10.3)
Chloride: 105 mmol/L (ref 98–111)
Creatinine, Ser: 0.65 mg/dL (ref 0.44–1.00)
GFR, Estimated: >60 mL/min (ref >60)
Glucose, Bld: 91 mg/dL (ref 70–99)
Potassium: 3.6 mmol/L (ref 3.5–5.1)
Sodium: 135 mmol/L (ref 135–145)

## 2021-03-18 MED ORDER — NITROFURANTOIN MONOHYD MACRO 100 MG PO CAPS: 100.0000 mg | ORAL_CAPSULE | Freq: Every day | ORAL | 5 refills | Status: DC

## 2021-03-23 NOTE — Progress Notes (Signed)
Cardiology Office Note:    Date:  03/23/2021   ID:  Margaret Bryan, DOB 04/24/1961, MRN [id number]  PCP:  [name], MD  [facility] Cardiologist:  Ida Rogue, MD  [place] Electrophysiologist:  None   Referring MD: [name], MD   Chief Complaint: follow-up for lower leg edema  History of Present Illness:    Margaret Bryan is a 60 y.o. female with a hx of chronic pancreatitis, HLD, chest pain, family history of CAD who presents for 1 month follow-up.   Seen 01/2021 for chest pain. Work-up in the hospital negative for ACS. CTA chest negative for PE, no signficant coronary calcification, and noted aortic atherosclerosis. She was started on Imdur, but had not taken it when she was admitted. She had persistent chest pain so a coronary CT was ordered. This showed a calcium score of 0, no CAD, low risk for coronary events. She was started on amlodipine 2.5mg  daily. She was seen back in follow-up and reported improved symptoms.   The patient called 02/10/21 and reported lower leg edema and was seen in the office 02/22/21, noted 1+LLE. She was started on HCTZ, however patient had a reaction as well as hypokalemia, so HCTZ and potassium were stopped. Echo was ordered which showed LVEF 55-60%, no WMA, normal diasotlic parameters. LLE US showed no evidence of DVT.   Today, she says HCTZ gave her blisters on her feet, knees and irritation of skin in other areas. Also had some stomach upset and diarrhea. She thinks Potassium made stomach upset even more. She was on HCTZ for a little over a week, when she was on it she felt LLE was mildly improved. She felt swelling would never go completely down. She eats low salt. She has been wearing compression socks, but not religiously. She feels this hasn't helped. She elevated her legs at night. No chest pain, sob, palpitations. On exam she has trace LLE on edam, however she says this is a good day for her.    Past Medical History:   Diagnosis Date   Cancer ([place])    skin ca   GERD (gastroesophageal reflux disease)    Heart murmur    Migraine    Pancreas divisum     Past Surgical History:  Procedure Laterality Date   ABDOMINAL HYSTERECTOMY     NASAL SINUS SURGERY      Current Medications: No outpatient medications have been marked as taking for the 03/25/21 encounter (Appointment) with Kathlen Mody, Pier Laux H, PA-C.     Allergies:   Iodinated diagnostic agents, Hctz [hydrochlorothiazide], Potassium-containing compounds, Imitrex [sumatriptan], and Percocet [oxycodone-acetaminophen]   Social History   Socioeconomic History   Marital status: Married    Spouse name: Not on file   Number of children: Not on file   Years of education: Not on file   Highest education level: Not on file  Occupational History   Not on file  Tobacco Use   Smoking status: Never   Smokeless tobacco: Never  Vaping Use   Vaping Use: Never used  Substance and Sexual Activity   Alcohol use: No   Drug use: Never   Sexual activity: Yes  Other Topics Concern   Not on file  Social History Narrative   Not on file   Social Determinants of Health   Financial Resource Strain: Not on file  Food Insecurity: Not on file  Transportation Needs: Not on file  Physical Activity: Not on file  Stress: Not on file  Social Connections: Not on file     Family History: The patient's family history includes Breast cancer (age of onset: 58) in her cousin; Breast cancer (age of onset: 73) in her maternal aunt; Breast cancer (age of onset: 86) in her maternal grandmother; Esophageal cancer in her mother; Heart failure in her father and mother; Hypertension in her father and mother.  ROS:   Please see the history of present illness.     All other systems reviewed and are negative.  EKGs/Labs/Other Studies Reviewed:    The following studies were reviewed today:  Echo 02/23/21  1. Left ventricular ejection fraction, by estimation, is 55 to 60%. The   left ventricle has normal function. The left ventricle has no regional  wall motion abnormalities. Left ventricular diastolic parameters were  normal.   2. Right ventricular systolic function is normal. The right ventricular  size is normal.   3. The mitral valve is normal in structure. No evidence of mitral valve  regurgitation.   4. The aortic valve is normal in structure. Aortic valve regurgitation is  not visualized.   5. The inferior vena cava is normal in size with greater than 50%  respiratory variability, suggesting right atrial pressure of 3 mmHg.   EKG:  EKG is not ordered today.    Recent Labs: 09/01/2020: ALT 23; Hemoglobin 13.7; Platelets 260 02/03/2021: TSH 2.170 03/18/2021: BUN 20; Creatinine, Ser 0.65; Potassium 3.6; Sodium 135  Recent Lipid Panel No results found for: CHOL, TRIG, HDL, CHOLHDL, VLDL, LDLCALC, LDLDIRECT   Physical Exam:    VS:  There were no vitals taken for this visit.    Wt Readings from Last 3 Encounters:  02/22/21 199 lb 2 oz (90.3 kg)  02/03/21 202 lb 6.4 oz (91.8 kg)  02/28/20 195 lb 1.7 oz (88.5 kg)     GEN:  Well nourished, well developed in no acute distress HEENT: Normal NECK: No JVD; No carotid bruits LYMPHATICS: No lymphadenopathy CARDIAC: RRR, no murmurs, rubs, gallops RESPIRATORY:  Clear to auscultation without rales, wheezing or rhonchi  ABDOMEN: Soft, non-tender, non-distended MUSCULOSKELETAL:  Trace lower leg edema; No deformity  SKIN: Warm and dry NEUROLOGIC:  Alert and oriented x 3 PSYCHIATRIC:  Normal affect   ASSESSMENT:    1. Essential hypertension   2. Hyperlipidemia, unspecified hyperlipidemia type   3. Leg swelling   4. Chest pain, unspecified type    PLAN:    In order of problems listed above:  Atypical chest pain No further chest pain episodes. Prior coronary CT showed calcium score of 0, no evidence of CAD. Continue Ranexa and statin.   Lower leg edema Echo showed LVEF 55-60% and normal diastolic  parameters. Patient trialed HCTZ for over a week but had sensitivity reaction and this was stopped. Also had hypokalemia and she was given potassium supplement. Reports lower leg edema mildly improved while on HCTZ, but never went away. She reports she still has lower leg edema daily. I stressed the importance of low salt, compression socks and leg elevation. On exam she has trace lower leg edema, however she says this is a good day for her. We discussed other reasons for lower leg edema including steroid injections, arthritis, inflammation, diet. After discussion, I will given her 30 day lasix supply. We will try lasix 20mg  with potassium 17mEQ for 3 days, she will call us back in 3 days to report changes. Suspect she can take them as needed, but again, stressed the importance of conservative management. BMET  today to check electrolytes.  Palpitations Unchanged from prior visit. Discussed with patient. no further work-up at this time.   HTN BP normotensive. Lasix PRN, other wise not on antihypertensives.  HLD Continue Crestor 5 mg daily. Management per PCP   Disposition: Follow up in 1 year(s) with MD/APP    Signed, Dharma Pare Ninfa Meeker, PA-C  03/23/2021 11:57 AM    [place]

## 2021-03-25 ENCOUNTER — Other Ambulatory Visit: Payer: Self-pay

## 2021-03-25 ENCOUNTER — Encounter: Payer: Self-pay | Admitting: Medical

## 2021-03-25 ENCOUNTER — Ambulatory Visit (INDEPENDENT_AMBULATORY_CARE_PROVIDER_SITE_OTHER): Payer: BC Managed Care – PPO | Admitting: Medical

## 2021-03-25 VITALS — BP 120/70 | HR 72 | Ht 68.0 in | Wt 194.2 lb

## 2021-03-25 DIAGNOSIS — M7989 Other specified soft tissue disorders: Secondary | ICD-10-CM | POA: Diagnosis not present

## 2021-03-25 DIAGNOSIS — R079 Chest pain, unspecified: Secondary | ICD-10-CM

## 2021-03-25 DIAGNOSIS — R002 Palpitations: Secondary | ICD-10-CM | POA: Diagnosis not present

## 2021-03-25 DIAGNOSIS — E785 Hyperlipidemia, unspecified: Secondary | ICD-10-CM | POA: Diagnosis not present

## 2021-03-25 DIAGNOSIS — I10 Essential (primary) hypertension: Secondary | ICD-10-CM | POA: Diagnosis not present

## 2021-03-25 DIAGNOSIS — R600 Localized edema: Secondary | ICD-10-CM | POA: Diagnosis not present

## 2021-03-25 MED ORDER — FUROSEMIDE 20 MG PO TABS: ORAL_TABLET | ORAL | 0 refills | Status: DC

## 2021-03-25 MED ORDER — POTASSIUM CHLORIDE ER 10 MEQ PO TBCR: EXTENDED_RELEASE_TABLET | ORAL | 0 refills | Status: DC

## 2021-03-25 NOTE — Patient Instructions (Signed)
Medication Instructions:  - Your physician has recommended you make the following change in your medication:   1) START lasix (furosemide) 20 mg- take 1 tablet by mouth once daily x 3 days, then call the office with how you are doing  2) START potassium 10 meq- take 1 tablet by mouth once daily x 3 days, then call the office with how you are doing  *If you need a refill on your cardiac medications before your next appointment, please call your pharmacy*   Lab Work: - Your physician recommends that you have lab work today: BMP  If you have labs (blood work) drawn today and your tests are completely normal, you will receive your results only by: MyChart Message (if you have [place]) OR A paper copy in the mail If you have any lab test that is abnormal or we need to change your treatment, we will call you to review the results.   Testing/Procedures: - none ordered   Follow-Up: At [facility], you and your health needs are our priority.  As part of our continuing mission to provide you with exceptional heart care, we have created designated Provider Care Teams.  These Care Teams include your primary Cardiologist (physician) and Advanced Practice Providers (APPs -  Physician Assistants and Nurse Practitioners) who all work together to provide you with the care you need, when you need it.  We recommend signing up for the patient portal called "MyChart".  Sign up information is provided on this After Visit Summary.  MyChart is used to connect with patients for Virtual Visits (Telemedicine).  Patients are able to view lab/test results, encounter notes, upcoming appointments, etc.  Non-urgent messages can be sent to your provider as well.   To learn more about what you can do with MyChart, go to [email or web address].    Your next appointment:   1 year(s)  The format for your next appointment:   In Person  Provider:   You may see Ida Rogue, MD or one of the following Advanced  Practice Providers on your designated Care Team:   Murray Hodgkins, NP Christell Faith, PA-C Marrianne Mood, PA-C Cadence Kathlen Mody, [place]   Other Instructions

## 2021-03-26 LAB — BASIC METABOLIC PANEL
BUN/Creatinine Ratio: 20 (ref 9–23)
BUN: 14 mg/dL (ref 6–24)
CO2: 16 mmol/L — ABNORMAL LOW (ref 20–29)
Calcium: 9.3 mg/dL (ref 8.7–10.2)
Chloride: 111 mmol/L — ABNORMAL HIGH (ref 96–106)
Creatinine, Ser: 0.71 mg/dL (ref 0.57–1.00)
Glucose: 99 mg/dL (ref 65–99)
Potassium: 4.1 mmol/L (ref 3.5–5.2)
Sodium: 141 mmol/L (ref 134–144)
eGFR: 98 mL/min/{1.73_m2} (ref >59)

## 2021-03-28 ENCOUNTER — Telehealth: Payer: Self-pay | Admitting: Medical

## 2021-03-28 NOTE — Telephone Encounter (Signed)
Pt c/o medication issue:  1. Name of Medication: furosemide  2. How are you currently taking this medication (dosage and times per day)? new  3. Are you having a reaction (difficulty breathing--STAT)? No   4. What is your medication issue? Pharmacy will not fill due to allergy to Imitrex and similar or same ingredients   Please call to discuss.

## 2021-03-28 NOTE — Telephone Encounter (Signed)
Furth, Cadence H, PA-C  03/28/21 (4:41 PM)   Will review and call the patient back. In the meanwhile continue with lifestyle changes as previously discussed.

## 2021-03-30 NOTE — Telephone Encounter (Signed)
Patient has also sent a mychart message. Cadence's response below sent to the patient through mychart. Closing this encounter.

## 2021-04-05 MED ORDER — SPIRONOLACTONE 25 MG PO TABS: 12.5000 mg | ORAL_TABLET | Freq: Every day | ORAL | 3 refills | Status: DC

## 2021-04-05 NOTE — Addendum Note (Signed)
Addended by: Valora Corporal on: 04/05/2021 10:39 AM   Modules accepted: Orders

## 2021-04-05 NOTE — Telephone Encounter (Signed)
Spoke with patient and reviewed provider recommendations. She reports that she never did start the furosemide but did start wearing compression hose. She stated that swelling has improved some with wearing those socks/hose. Instructed her to start Spironolactone 25 mg and take 1/2 tablet once a day with repeat labs in one week. Reviewed that she can go to the [place] at [facility] and go to 1st desk on the right to check in, past the screening table. No appointment is needed for this. Lab hours: Monday- Friday (7:30 am- 5:30 pm) She has done this before with no further instruction needed. Advised provider recommendations of compression hose, low salt diet, and leg elevation. She verbalized understanding of our conversation, agreement with plan, and had no further questions at this time.   Sending clarification to provider on instructions of her potassium.   Furth, Cadence H, PA-C  [facility] [place] Triage 1 hour ago (8:55 AM)    After review of medications, we will stop the lasix. And send in spironolactone 12.'5mg'$  daly for lower leg swelling. BMET in 1 week. Please encourage compression socks, low salt diet, and leg elevation. Also, please ask if lower leg swelling has improved any.

## 2021-04-07 NOTE — Telephone Encounter (Signed)
Spoke with patient and reviewed that she should not take the potassium since we stopped her furosemide and started her on spironolactone. She verbalized understanding of these instructions and no further questions at this time.

## 2021-04-07 NOTE — Addendum Note (Signed)
Addended by: Valora Corporal on: 04/07/2021 08:50 AM   Modules accepted: Orders

## 2021-04-15 ENCOUNTER — Telehealth: Payer: Self-pay | Admitting: Medical

## 2021-04-15 DIAGNOSIS — Z79899 Other long term (current) drug therapy: Secondary | ICD-10-CM

## 2021-04-15 NOTE — Telephone Encounter (Signed)
Called patient back. Informed her that order would be placed so she could go get her lab work done later today or tomorrow.

## 2021-04-15 NOTE — Telephone Encounter (Signed)
Patient states she went to [facility] to have labs drawn and they were unable to find her orders. Please call to discuss.

## 2021-04-18 ENCOUNTER — Other Ambulatory Visit
Admission: RE | Admit: 2021-04-18 | Discharge: 2021-04-18 | Disposition: A | Discharge status: Home / Self Care | Payer: BC Managed Care – PPO | Source: Ambulatory Visit | Attending: Medical | Admitting: Medical

## 2021-04-18 DIAGNOSIS — Z79899 Other long term (current) drug therapy: Secondary | ICD-10-CM | POA: Insufficient documentation

## 2021-04-18 LAB — BASIC METABOLIC PANEL
Anion gap: 8 (ref 5–15)
BUN: 19 mg/dL (ref 6–20)
CO2: 23 mmol/L (ref 22–32)
Calcium: 9.1 mg/dL (ref 8.9–10.3)
Chloride: 105 mmol/L (ref 98–111)
Creatinine, Ser: 0.86 mg/dL (ref 0.44–1.00)
GFR, Estimated: >60 mL/min (ref >60)
Glucose, Bld: 91 mg/dL (ref 70–99)
Potassium: 4 mmol/L (ref 3.5–5.1)
Sodium: 136 mmol/L (ref 135–145)

## 2021-04-18 NOTE — Addendum Note (Signed)
Addended by: Darlyne Russian on: 04/18/2021 12:03 PM   Modules accepted: Orders

## 2021-04-18 NOTE — Telephone Encounter (Signed)
Patient is calling and states that her lab orders are still not able to be seen on the lab end at [facility]. Please call to discuss.

## 2021-04-18 NOTE — Telephone Encounter (Signed)
Spoke to pt. Notified I have corrected lab ordered to be completed at the [place] at [facility]. [facility]) Pt appreciative and voiced understanding.

## 2021-06-05 ENCOUNTER — Other Ambulatory Visit: Payer: Self-pay | Admitting: Cardiovascular Disease

## 2021-06-06 ENCOUNTER — Other Ambulatory Visit: Payer: Self-pay | Admitting: Nurse Practitioner

## 2021-06-08 ENCOUNTER — Ambulatory Visit
Admission: EM | Admit: 2021-06-08 | Discharge: 2021-06-08 | Disposition: A | Discharge status: Home / Self Care | Payer: BC Managed Care – PPO | Attending: Emergency Medicine | Admitting: Emergency Medicine

## 2021-06-08 ENCOUNTER — Other Ambulatory Visit: Payer: Self-pay

## 2021-06-08 DIAGNOSIS — R1013 Epigastric pain: Secondary | ICD-10-CM | POA: Insufficient documentation

## 2021-06-08 DIAGNOSIS — R350 Frequency of micturition: Secondary | ICD-10-CM | POA: Insufficient documentation

## 2021-06-08 DIAGNOSIS — R42 Dizziness and giddiness: Secondary | ICD-10-CM | POA: Diagnosis present

## 2021-06-08 LAB — URINALYSIS, COMPLETE (UACMP) WITH MICROSCOPIC
Bilirubin Urine: NEGATIVE
Glucose, UA: NEGATIVE mg/dL
Hgb urine dipstick: NEGATIVE
Ketones, ur: NEGATIVE mg/dL
Leukocytes,Ua: NEGATIVE
Nitrite: NEGATIVE
Protein, ur: NEGATIVE mg/dL
Specific Gravity, Urine: 1.015 (ref 1.005–1.030)
pH: 7 (ref 5.0–8.0)

## 2021-06-08 LAB — COMPREHENSIVE METABOLIC PANEL
ALT: 18 U/L (ref 0–44)
AST: 19 U/L (ref 15–41)
Albumin: 4.2 g/dL (ref 3.5–5.0)
Alkaline Phosphatase: 80 U/L (ref 38–126)
Anion gap: 9 (ref 5–15)
BUN: 22 mg/dL — ABNORMAL HIGH (ref 6–20)
CO2: 22 mmol/L (ref 22–32)
Calcium: 9 mg/dL (ref 8.9–10.3)
Chloride: 104 mmol/L (ref 98–111)
Creatinine, Ser: 0.76 mg/dL (ref 0.44–1.00)
GFR, Estimated: >60 mL/min (ref >60)
Glucose, Bld: 97 mg/dL (ref 70–99)
Potassium: 3.8 mmol/L (ref 3.5–5.1)
Sodium: 135 mmol/L (ref 135–145)
Total Bilirubin: 0.7 mg/dL (ref 0.3–1.2)
Total Protein: 7.2 g/dL (ref 6.5–8.1)

## 2021-06-08 LAB — CBC WITH DIFFERENTIAL/PLATELET
Abs Immature Granulocytes: 0.01 10*3/uL (ref 0.00–0.07)
Basophils Absolute: 0 10*3/uL (ref 0.0–0.1)
Basophils Relative: 1 %
Eosinophils Absolute: 0.1 10*3/uL (ref 0.0–0.5)
Eosinophils Relative: 1 %
HCT: 40.7 % (ref 36.0–46.0)
Hemoglobin: 13.8 g/dL (ref 12.0–15.0)
Immature Granulocytes: 0 %
Lymphocytes Relative: 35 %
Lymphs Abs: 2.4 10*3/uL (ref 0.7–4.0)
MCH: 30.9 pg (ref 26.0–34.0)
MCHC: 33.9 g/dL (ref 30.0–36.0)
MCV: 91.1 fL (ref 80.0–100.0)
Monocytes Absolute: 0.5 10*3/uL (ref 0.1–1.0)
Monocytes Relative: 7 %
Neutro Abs: 3.7 10*3/uL (ref 1.7–7.7)
Neutrophils Relative %: 56 %
Platelets: 277 10*3/uL (ref 150–400)
RBC: 4.47 MIL/uL (ref 3.87–5.11)
RDW: 13.4 % (ref 11.5–15.5)
WBC: 6.7 10*3/uL (ref 4.0–10.5)
nRBC: 0 % (ref 0.0–0.2)

## 2021-06-08 LAB — LIPASE, BLOOD: Lipase: 173 U/L — ABNORMAL HIGH (ref 11–51)

## 2021-06-08 MED ORDER — MECLIZINE HCL 25 MG PO TABS: 25.0000 mg | ORAL_TABLET | Freq: Three times a day (TID) | ORAL | 0 refills | Status: AC | PRN

## 2021-06-08 NOTE — ED Provider Notes (Signed)
[facility]    CSN: [id number] Arrival date & time: 06/08/21  1432      History   Chief Complaint Chief Complaint  Patient presents with   Abdominal Pain   Urinary Frequency    HPI Margaret Bryan is a 60 y.o. female.   HPI  60 year old female here for evaluation of multiple complaints.  Patient is here because she thinks she is experiencing a pancreatitis flare as she has been having epigastric abdominal pain for the last 2 and half weeks.  She states this pain is not necessarily made worse or improved with eating that she just has to watch what she eats.  The pain will occasionally radiate to her left lower quadrant as it typically does when she has a pancreatitis flare.  This is been associate with intermittent nausea but no vomiting.  She has had some loose stools per her report.  Her secondary complaint is pain in her lower back and increased urinary urgency.  She states she feels like she does not completely empty her bladder.  This has been going on for a week.  She is also reporting that she is having intermittent feeling of being off balance that started an hour ago.  She does have a history of vertigo but this is not room spinning as with her typical vertigo symptoms.  She denies fever, runny nose or nasal congestion, sore throat, vomiting, painful urination or urinary frequency, blood in her urine or cloudiness to her urine.  Past Medical History:  Diagnosis Date   Cancer ([place])    skin ca   GERD (gastroesophageal reflux disease)    Heart murmur    Migraine    Pancreas divisum     Patient Active Problem List   Diagnosis Date Noted   History of pancreatitis    Migraine    Chest pain 01/07/2020   H/O: hysterectomy 03/22/2018    Past Surgical History:  Procedure Laterality Date   ABDOMINAL HYSTERECTOMY     NASAL SINUS SURGERY      OB History     Gravida  3   Para  3   Term      Preterm      AB      Living         SAB      IAB       Ectopic      Multiple      Live Births               Home Medications    Prior to Admission medications   Medication Sig Start Date End Date Taking? Authorizing Provider  eletriptan (RELPAX) 40 MG tablet Take 40 mg by mouth as needed for migraine or headache. May repeat in 2 hours if headache persists or recurs.   Yes [provider]  esomeprazole (NEXIUM) 40 MG capsule Take 1 capsule (40 mg total) by mouth daily. 07/01/18  Yes Melynda Ripple, MD  fexofenadine (ALLEGRA) 180 MG tablet Take 180 mg by mouth daily.   Yes [provider]  fluticasone (FLONASE) 50 MCG/ACT nasal spray Place 2 sprays into both nostrils daily as needed for allergies or rhinitis.    Yes [provider]  Magnesium Oxide 250 MG TABS Take 250 mg by mouth daily.    Yes [provider]  meclizine (ANTIVERT) 25 MG tablet Take 1 tablet (25 mg total) by mouth 3 (three) times daily as needed for dizziness. 06/08/21  Yes Medrith Veillon,  Ysidro Evert, NP  montelukast (SINGULAIR) 10 MG tablet Take by mouth. 12/31/20  Yes [provider]  Multiple Vitamins-Minerals (ONE-A-DAY 50 PLUS PO) Take 1 tablet by mouth daily.    Yes [provider]  nitroGLYCERIN (NITROSTAT) 0.4 MG SL tablet Place 1 tablet (0.4 mg total) under the tongue every 5 (five) minutes as needed for chest pain. 01/08/20  Yes Nolberto Hanlon, MD  ondansetron (ZOFRAN-ODT) 8 MG disintegrating tablet Take 8 mg by mouth every 8 (eight) hours as needed for nausea or vomiting.   Yes [provider]  PAPAYA PO Take by mouth. Prn for pancreas   Yes [provider]  ranolazine (RANEXA) 500 MG 12 hr tablet TAKE 1 TABLET BY MOUTH TWICE A DAY 06/06/21  Yes Gollan, Kathlene November, MD  rosuvastatin (CRESTOR) 5 MG tablet Take by mouth. 01/31/21 01/31/22 Yes [provider]  spironolactone (ALDACTONE) 25 MG tablet Take 0.5 tablets (12.5 mg total) by mouth daily. 04/05/21 07/04/21 Yes Furth, Cadence H, PA-C  thiamine 100 MG  tablet Take by mouth.   Yes [provider]  topiramate (TOPAMAX) 100 MG tablet Take 100 mg by mouth 2 (two) times daily.   Yes [provider]  zinc sulfate 220 (50 Zn) MG capsule Take by mouth.   Yes [provider]  amLODipine (NORVASC) 2.5 MG tablet Take 1 tablet (2.5 mg total) by mouth daily. 01/26/20 02/28/20  Loel Dubonnet, NP    Family History Family History  Problem Relation Age of Onset   Heart failure Mother    Hypertension Mother    Esophageal cancer Mother    Heart failure Father    Hypertension Father    Breast cancer Maternal Aunt 35   Breast cancer Maternal Grandmother 54   Breast cancer Cousin 20    Social History Social History   Tobacco Use   Smoking status: Never   Smokeless tobacco: Never  Vaping Use   Vaping Use: Never used  Substance Use Topics   Alcohol use: No   Drug use: Never     Allergies   Iodinated diagnostic agents, Hctz [hydrochlorothiazide], Potassium, Potassium-containing compounds, Imitrex [sumatriptan], and Percocet [oxycodone-acetaminophen]   Review of Systems Review of Systems  Constitutional:  Negative for activity change, appetite change and fever.  HENT:  Negative for congestion, ear pain and rhinorrhea.   Respiratory:  Negative for cough.   Gastrointestinal:  Positive for abdominal pain, diarrhea and nausea. Negative for vomiting.  Genitourinary:  Positive for urgency. Negative for dysuria, frequency and hematuria.  Musculoskeletal:  Positive for back pain.  Skin:  Negative for rash.  Neurological:  Positive for light-headedness. Negative for syncope.  Hematological: Negative.   Psychiatric/Behavioral: Negative.      Physical Exam Triage Vital Signs ED Triage Vitals  Enc Vitals Group     BP      Pulse      Resp      Temp      Temp src      SpO2      Weight      Height      Head Circumference      Peak Flow      Pain Score      Pain Loc      Pain Edu?      Excl. in [place]?    No  data found.  Updated Vital Signs BP (!) 145/92 (BP Location: Left Arm)   Pulse 85   Temp 98.1 F (36.7 C) (Oral)  Resp 18   Ht 5\' 8"  (1.727 m)   Wt 183 lb (83 kg)   SpO2 98%   BMI 27.83 kg/m   Visual Acuity Right Eye Distance:   Left Eye Distance:   Bilateral Distance:    Right Eye Near:   Left Eye Near:    Bilateral Near:     Physical Exam Vitals and nursing note reviewed.  Constitutional:      General: She is not in acute distress.    Appearance: Normal appearance. She is not ill-appearing.  HENT:     Head: Normocephalic and atraumatic.     Right Ear: Tympanic membrane, ear canal and external ear normal. There is no impacted cerumen.     Left Ear: Tympanic membrane, ear canal and external ear normal. There is no impacted cerumen.     Mouth/Throat:     Mouth: Mucous membranes are moist.     Pharynx: Oropharynx is clear. No posterior oropharyngeal erythema.  Eyes:     General: No scleral icterus.       Right eye: No discharge.        Left eye: No discharge.     Extraocular Movements: Extraocular movements intact.     Conjunctiva/sclera: Conjunctivae normal.     Pupils: Pupils are equal, round, and reactive to light.  Cardiovascular:     Rate and Rhythm: Normal rate and regular rhythm.     Pulses: Normal pulses.     Heart sounds: Normal heart sounds. No murmur heard.   No gallop.  Pulmonary:     Effort: Pulmonary effort is normal.     Breath sounds: Normal breath sounds. No wheezing, rhonchi or rales.  Abdominal:     General: Abdomen is flat. Bowel sounds are normal. There is no distension.     Palpations: Abdomen is soft.     Tenderness: There is abdominal tenderness. There is no right CVA tenderness, left CVA tenderness, guarding or rebound.  Musculoskeletal:     Cervical back: Normal range of motion and neck supple.  Lymphadenopathy:     Cervical: No cervical adenopathy.  Skin:    General: Skin is warm and dry.     Capillary Refill: Capillary refill  takes less than 2 seconds.     Findings: No erythema or rash.  Neurological:     General: No focal deficit present.     Mental Status: She is alert and oriented to person, place, and time.  Psychiatric:        Mood and Affect: Mood normal.        Behavior: Behavior normal.        Thought Content: Thought content normal.        Judgment: Judgment normal.     UC Treatments / Results  Labs (all labs ordered are listed, but only abnormal results are displayed) Labs Reviewed  URINALYSIS, COMPLETE (UACMP) WITH MICROSCOPIC - Abnormal; Notable for the following components:      Result Value   Bacteria, UA FEW (*)    All other components within normal limits  COMPREHENSIVE METABOLIC PANEL - Abnormal; Notable for the following components:   BUN 22 (*)    All other components within normal limits  LIPASE, BLOOD - Abnormal; Notable for the following components:   Lipase 173 (*)    All other components within normal limits  CBC WITH DIFFERENTIAL/PLATELET    EKG   Radiology No results found.  Procedures Procedures (including critical care time)  Medications Ordered in UC  Medications - No data to display  Initial Impression / Assessment and Plan / UC Course  I have reviewed the triage vital signs and the nursing notes.  Pertinent labs & imaging results that were available during my care of the patient were reviewed by me and considered in my medical decision making (see chart for details).  Patient is a nontoxic-appearing 60 year old female here for evaluation of multiple complaints to include abdominal pain, urinary urgency that is coupled with incomplete emptying of her bladder, and onset of lightheadedness 1 hour prior to arrival.  Patient is not in any acute distress and she was able to transition from the chair to the exam table without difficulty, ataxia, or imbalance.  Patient's physical exam reveals pearly gray tympanic membranes bilaterally with a normal light reflex and  clear external auditory canals.  Oropharyngeal mucosa is pink and moist without erythema, edema, or discharge.  No cervical lymphadenopathy appreciated on exam.  Pupils equal round and reactive and EOM is intact.  Cranial nerves II through XII are intact.  Cardiopulmonary exam reveals clear lung sounds in all fields.  Patient's upper and lower extremity strength and grips bilaterally are 5/5.  Abdomen is soft, flat, with positive bowel sounds in all 4 quadrants.  Patient does have mild epigastric and left upper quadrant tenderness without guarding or rebound.  She similarly has mild suprapubic tenderness.  No left lower quadrant right lower quadrant tenderness noted and no right upper quadrant tenderness noted.  We will check urinalysis, CBC, CMP, and lipase.  Patient vies that if her lipase is elevated she needs to follow-up with her hepatobiliary specialist.  If her lipase is markedly elevated and she has elevated liver enzymes she will need to go to the hospital.  Patient verbalizes understanding of same.  Urinalysis shows 6-10 squamous epithelials and few bacteria but is otherwise unremarkable.  CBC is completely unremarkable.  CMP shows a mildly elevated BUN at 22 but is otherwise unremarkable.  Transaminases are normal.  Lipase is 173.  Is lipase is elevated but it is a decrease from her last reported lipase from 09/09/2020 that was 243.  Prior to that on 07/30/2020 her lipase was 139 and prior to that on 09/10/2019 her lipase was 286.  Patient's lipase falls within range parameters going back of the last 2 years.  Will recommend she follow-up with her hepatobiliary specialist.  There is no evidence of urinary tract infection on patient's urinalysis.  I will have her continue to monitor for any new or worsening symptoms.  The intermittent dizziness seems to be affected by position change and with the lack of abnormal findings on your exam I am suspicious that she may have some middle ear fluid.  I  will have her use over-the-counter antihistamines and return for reevaluation if her symptoms worsen, or go to the ER.   Final Clinical Impressions(s) / UC Diagnoses   Final diagnoses:  Urinary frequency  Episodic lightheadedness  Epigastric pain     Discharge Instructions      Your blood work today did show an elevated lipase but it is not outside of the range that you have been running over the past 2 years.  I recommend following up with your hepatobiliary specialist to discuss that further.  For your episodic dizziness: I suspect that this being caused by some fluid in your middle ear.  This is best managed with antihistamine use.  Use the meclizine every 8 hours as needed to help you with your  dizziness.  For your dysuria: Your urinalysis today did not show any evidence of urinary tract infection.  You can use over-the-counter Azo Standard as needed for increased bladder pressure and follow-up with your primary care doctor for further work-up if your symptoms continue.     ED Prescriptions     Medication Sig Dispense Auth. Provider   meclizine (ANTIVERT) 25 MG tablet Take 1 tablet (25 mg total) by mouth 3 (three) times daily as needed for dizziness. 30 tablet Margarette Canada, NP      PDMP not reviewed this encounter.   Margarette Canada, NP 06/08/21 1558

## 2021-06-08 NOTE — Discharge Instructions (Addendum)
Your blood work today did show an elevated lipase but it is not outside of the range that you have been running over the past 2 years.  I recommend following up with your hepatobiliary specialist to discuss that further.  For your episodic dizziness: I suspect that this being caused by some fluid in your middle ear.  This is best managed with antihistamine use.  Use the meclizine every 8 hours as needed to help you with your dizziness.  For your dysuria: Your urinalysis today did not show any evidence of urinary tract infection.  You can use over-the-counter Azo Standard as needed for increased bladder pressure and follow-up with your primary care doctor for further work-up if your symptoms continue.

## 2021-06-08 NOTE — ED Triage Notes (Signed)
Pt c/o possible pancreatitis flare, reports pain in her abdomen for about 2.5 weeks. Pt reports intermittent nausea. Pt does see a specialist at [facility] but has not seen them since last year. Pt is asking for labs to be drawn to check her pancreas. Pt also reports pain to her lower back and increased urinary need, also feels like she may not be emptying her bladder completely. Pt denies dysuria, hematuria or other urinary symptoms. Pt denies f/v/d, does report some loose stools.

## 2021-07-12 ENCOUNTER — Other Ambulatory Visit: Payer: Self-pay | Admitting: Gastroenterology

## 2021-07-12 ENCOUNTER — Other Ambulatory Visit (HOSPITAL_COMMUNITY): Payer: Self-pay | Admitting: Gastroenterology

## 2021-07-12 DIAGNOSIS — Q453 Other congenital malformations of pancreas and pancreatic duct: Secondary | ICD-10-CM

## 2021-07-12 DIAGNOSIS — R748 Abnormal levels of other serum enzymes: Secondary | ICD-10-CM

## 2021-07-12 DIAGNOSIS — R1013 Epigastric pain: Secondary | ICD-10-CM

## 2021-07-12 DIAGNOSIS — Z8719 Personal history of other diseases of the digestive system: Secondary | ICD-10-CM

## 2021-07-18 ENCOUNTER — Other Ambulatory Visit: Payer: Self-pay | Admitting: Family Medicine

## 2021-07-18 DIAGNOSIS — Z1231 Encounter for screening mammogram for malignant neoplasm of breast: Secondary | ICD-10-CM

## 2021-07-28 ENCOUNTER — Ambulatory Visit
Admission: RE | Admit: 2021-07-28 | Discharge: 2021-07-28 | Disposition: A | Discharge status: Home / Self Care | Payer: BC Managed Care – PPO | Source: Ambulatory Visit | Attending: Family Medicine | Admitting: Family Medicine

## 2021-07-28 ENCOUNTER — Other Ambulatory Visit: Payer: Self-pay

## 2021-07-28 DIAGNOSIS — Z1231 Encounter for screening mammogram for malignant neoplasm of breast: Secondary | ICD-10-CM | POA: Diagnosis present

## 2021-08-02 ENCOUNTER — Ambulatory Visit: Payer: PRIVATE HEALTH INSURANCE

## 2021-08-02 ENCOUNTER — Other Ambulatory Visit: Payer: Self-pay

## 2021-08-02 ENCOUNTER — Ambulatory Visit
Admission: RE | Admit: 2021-08-02 | Discharge: 2021-08-02 | Disposition: A | Discharge status: Home / Self Care | Payer: BC Managed Care – PPO | Source: Ambulatory Visit | Attending: Gastroenterology | Admitting: Gastroenterology

## 2021-08-02 DIAGNOSIS — Q453 Other congenital malformations of pancreas and pancreatic duct: Secondary | ICD-10-CM | POA: Insufficient documentation

## 2021-08-02 DIAGNOSIS — R1013 Epigastric pain: Secondary | ICD-10-CM | POA: Insufficient documentation

## 2021-08-02 DIAGNOSIS — Z8719 Personal history of other diseases of the digestive system: Secondary | ICD-10-CM | POA: Insufficient documentation

## 2021-08-02 DIAGNOSIS — R748 Abnormal levels of other serum enzymes: Secondary | ICD-10-CM | POA: Diagnosis present

## 2021-08-02 MED ORDER — IOHEXOL 300 MG/ML  SOLN
100.0000 mL | Freq: Once | INTRAMUSCULAR | Status: AC | PRN
  Administered 2021-08-02: 100 mL via INTRAVENOUS

## 2021-09-17 IMAGING — CR DG CHEST 2V
1 series · 2 of 2 positions shown · non-contrast
Comparison: July 18, 2016.

CLINICAL DATA: Chest pain radiating to left upper extremity

EXAM:
CHEST - 2 VIEW

[Series 1: dg chest 2 view · 0.14mm/px · 2 of 2 slices shown]
[im 1/2]
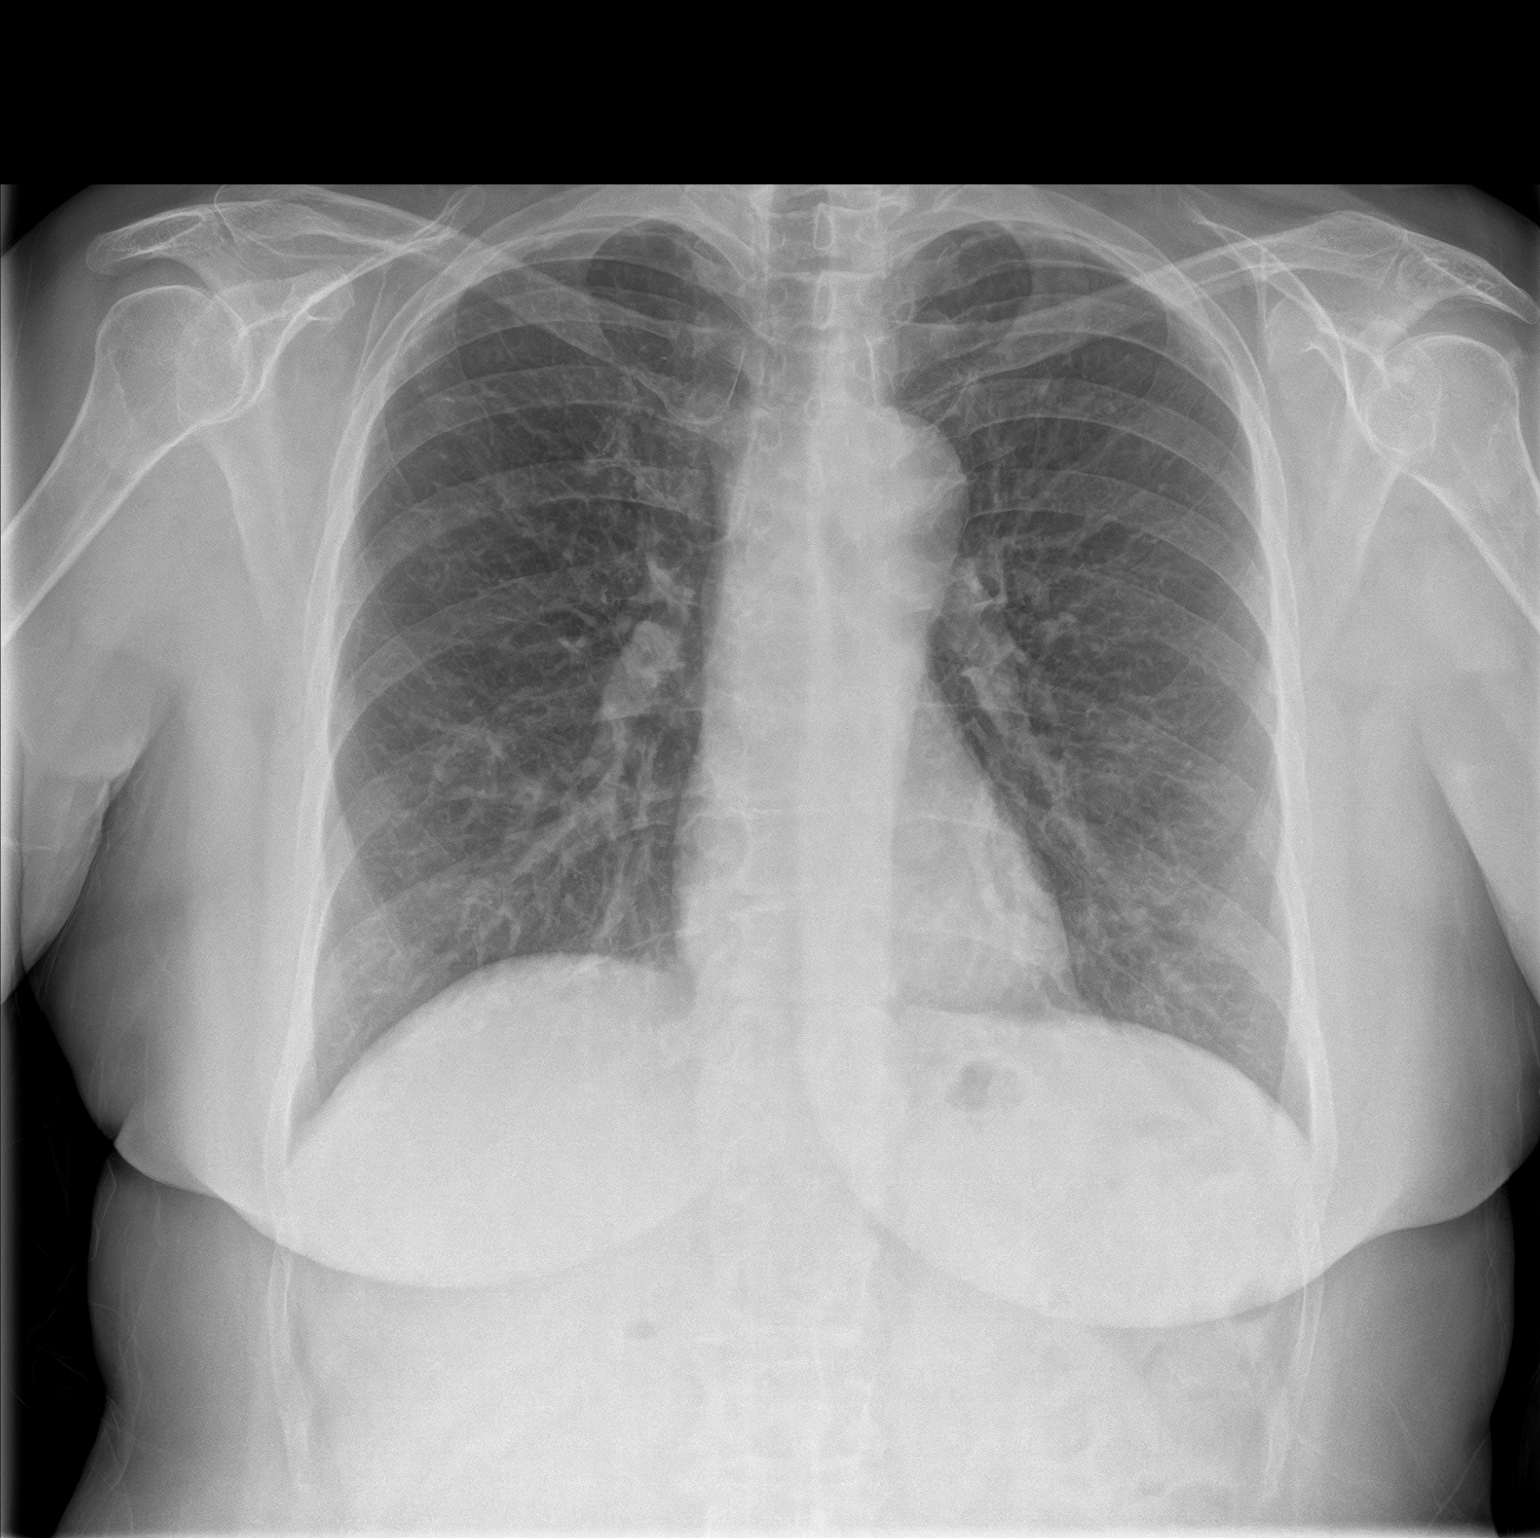
[im 2/2]
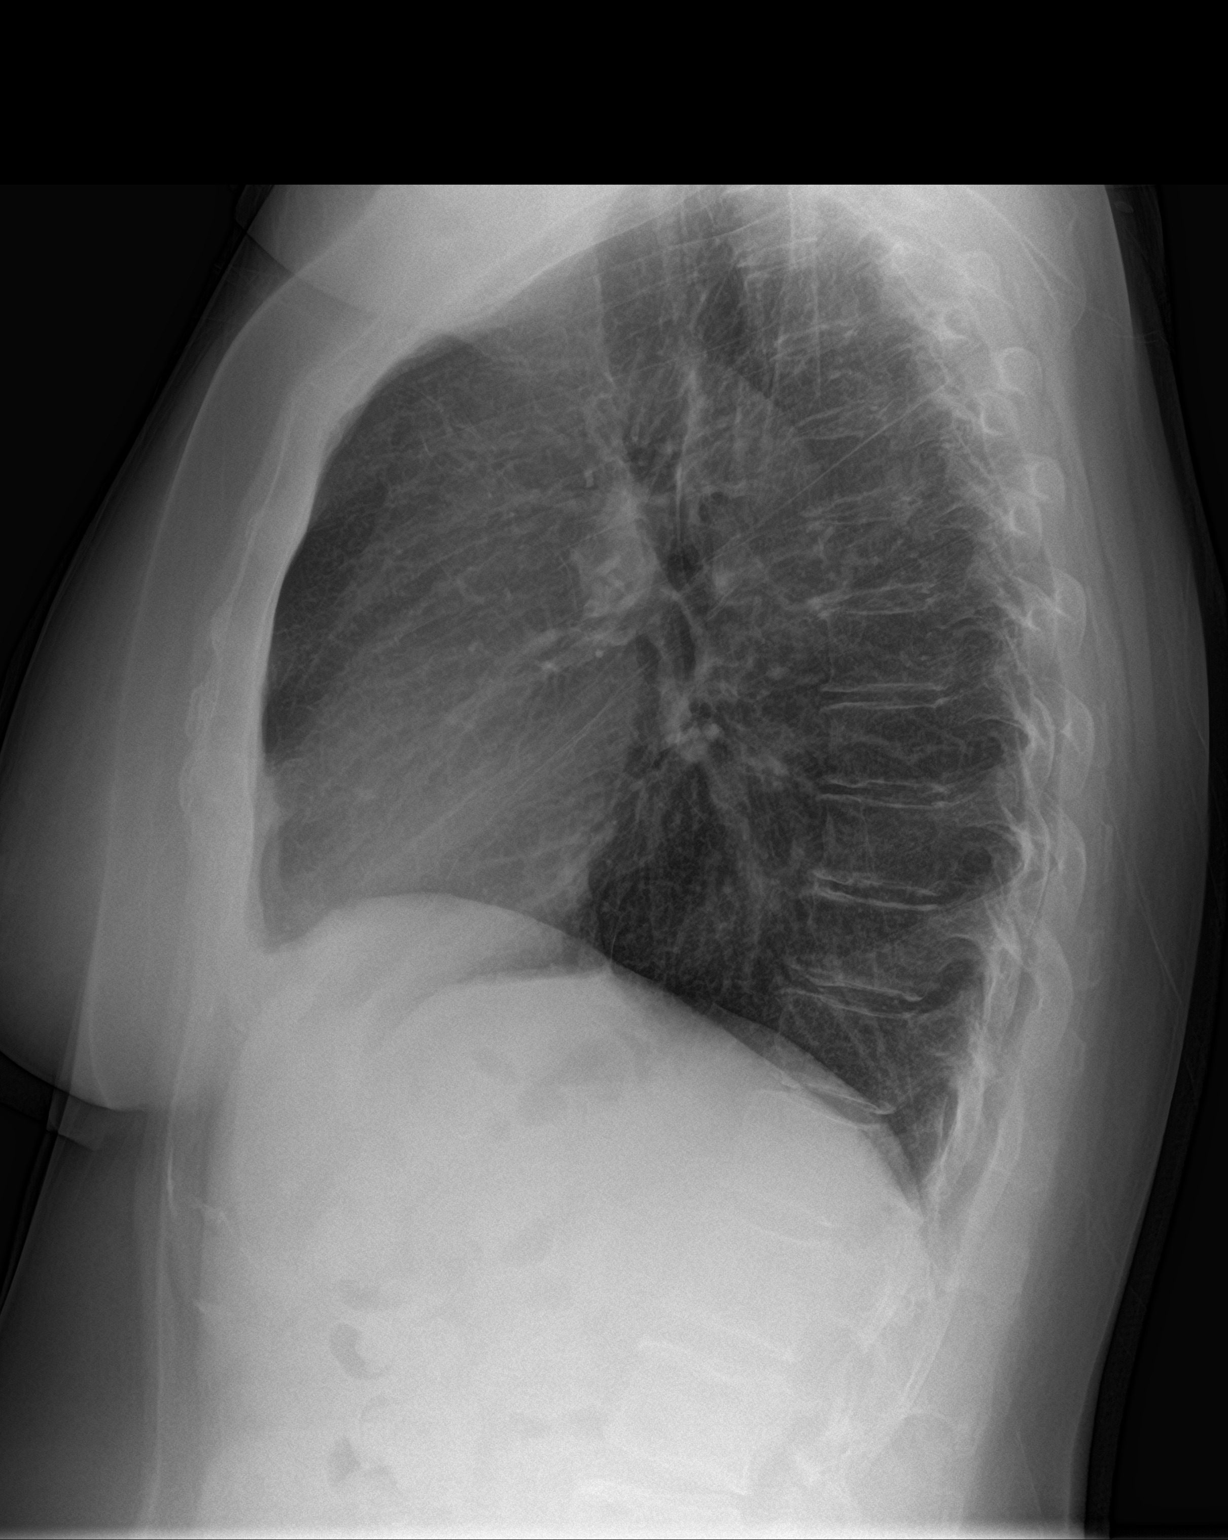

[2 of 2 positions shown; findings below may reference images not displayed]

FINDINGS: Lungs are clear. Heart size and pulmonary vascularity are normal. No
adenopathy. No bone lesions. No pneumothorax.
IMPRESSION: Lungs clear.  Cardiac silhouette within normal limits.

## 2021-11-21 ENCOUNTER — Encounter: Payer: Self-pay | Admitting: Emergency Medicine

## 2021-11-21 ENCOUNTER — Ambulatory Visit
Admission: EM | Admit: 2021-11-21 | Discharge: 2021-11-21 | Disposition: A | Discharge status: Home / Self Care | Payer: BC Managed Care – PPO | Attending: Physician Assistant | Admitting: Physician Assistant

## 2021-11-21 ENCOUNTER — Other Ambulatory Visit: Payer: Self-pay

## 2021-11-21 DIAGNOSIS — R531 Weakness: Secondary | ICD-10-CM | POA: Insufficient documentation

## 2021-11-21 DIAGNOSIS — R61 Generalized hyperhidrosis: Secondary | ICD-10-CM | POA: Diagnosis not present

## 2021-11-21 DIAGNOSIS — R42 Dizziness and giddiness: Secondary | ICD-10-CM | POA: Insufficient documentation

## 2021-11-21 LAB — CBC WITH DIFFERENTIAL/PLATELET
Abs Immature Granulocytes: 0.01 10*3/uL (ref 0.00–0.07)
Basophils Absolute: 0 10*3/uL (ref 0.0–0.1)
Basophils Relative: 1 %
Eosinophils Absolute: 0.2 10*3/uL (ref 0.0–0.5)
Eosinophils Relative: 3 %
HCT: 41.2 % (ref 36.0–46.0)
Hemoglobin: 13.7 g/dL (ref 12.0–15.0)
Immature Granulocytes: 0 %
Lymphocytes Relative: 32 %
Lymphs Abs: 1.8 10*3/uL (ref 0.7–4.0)
MCH: 31.1 pg (ref 26.0–34.0)
MCHC: 33.3 g/dL (ref 30.0–36.0)
MCV: 93.6 fL (ref 80.0–100.0)
Monocytes Absolute: 0.3 10*3/uL (ref 0.1–1.0)
Monocytes Relative: 6 %
Neutro Abs: 3.3 10*3/uL (ref 1.7–7.7)
Neutrophils Relative %: 58 %
Platelets: 251 10*3/uL (ref 150–400)
RBC: 4.4 MIL/uL (ref 3.87–5.11)
RDW: 13.1 % (ref 11.5–15.5)
WBC: 5.6 10*3/uL (ref 4.0–10.5)
nRBC: 0 % (ref 0.0–0.2)

## 2021-11-21 LAB — GLUCOSE, CAPILLARY: Glucose-Capillary: 96 mg/dL (ref 70–99)

## 2021-11-21 LAB — COMPREHENSIVE METABOLIC PANEL
ALT: 16 U/L (ref 0–44)
AST: 18 U/L (ref 15–41)
Albumin: 4.1 g/dL (ref 3.5–5.0)
Alkaline Phosphatase: 78 U/L (ref 38–126)
Anion gap: 8 (ref 5–15)
BUN: 19 mg/dL (ref 6–20)
CO2: 23 mmol/L (ref 22–32)
Calcium: 8.8 mg/dL — ABNORMAL LOW (ref 8.9–10.3)
Chloride: 104 mmol/L (ref 98–111)
Creatinine, Ser: 0.68 mg/dL (ref 0.44–1.00)
GFR, Estimated: >60 mL/min (ref >60)
Glucose, Bld: 105 mg/dL — ABNORMAL HIGH (ref 70–99)
Potassium: 3.9 mmol/L (ref 3.5–5.1)
Sodium: 135 mmol/L (ref 135–145)
Total Bilirubin: 0.4 mg/dL (ref 0.3–1.2)
Total Protein: 7.3 g/dL (ref 6.5–8.1)

## 2021-11-21 LAB — TSH: TSH: 2.281 u[IU]/mL (ref 0.350–4.500)

## 2021-11-21 LAB — LIPASE, BLOOD: Lipase: 187 U/L — ABNORMAL HIGH (ref 11–51)

## 2021-11-21 LAB — AMYLASE: Amylase: 150 U/L — ABNORMAL HIGH (ref 28–100)

## 2021-11-21 NOTE — ED Triage Notes (Signed)
Pt c/o weakness, dizziness, sweats, pre syncope, fatigue. Started this morning. She states she had a migraine yesterday. She states she has back between her shoulder blades, and shortness of breath. She states she took her BP 94/65 and then the 2nd time 117/77.  ?

## 2021-11-21 NOTE — Discharge Instructions (Addendum)
The cause of your symptoms is unknown at this time however we will work to rule out basic causes through lab work ? ?Your EKG showed that your heart is beating at a normal pace and rhythm ? ?Your blood sugar is 96, a normal blood sugar is typically between 70-100 ? ?Your neurological exam did not show any abnormality ? ?Your remaining lab work checking your thyroid, electrolytes, kidney, liver, pancreas are pending, you will be notified of any concerning values, you will also be able to see all values through your MyChart account ? ?Your vital signs at this time are stable within unconcerning blood pressure ? ?I would like you to follow-up with your primary care doctor in 1 week for reevaluation of symptoms ? ?At any point if your symptoms worsen please go to the nearest emergency department for further evaluation and management ?

## 2021-11-21 NOTE — ED Provider Notes (Signed)
?[place] ? ? ? ?CSN: [id number] ?Arrival date & time: 11/21/21  1220 ? ? ?  ? ?History   ?Chief Complaint ?Chief Complaint  ?Patient presents with  ? Dizziness  ? pre syncope  ? ? ?HPI ?Margaret Bryan is a 61 y.o. female.  ? ?Patient presents with generalized weakness, lightheadedness, dizziness, diaphoresis, shortness of breath at rest worsened by exertion, and mild posterior headache and intermittent centralized back pain beginning today.  Was applying make-up when symptoms began abruptly without precipitating event.  Endorses that she checked her blood pressure at time of the event and it was 94/65, rechecked 30 minutes later and it was 117/77, no history of hypertension.  Diaphoresis has resolved, remaining symptoms have reduced in intensity but are still present.  Endorses migraine that occurred yesterday, has resolved, current headache does not feel similar to migraines.  Has not attempted treatment of symptoms.  Symptoms have never occurred before.  History of migraines, GERD, heart murmur, pancreatitis. ? ? ?Past Medical History:  ?Diagnosis Date  ? Cancer [facility])   ? skin ca  ? GERD (gastroesophageal reflux disease)   ? Heart murmur   ? Migraine   ? Pancreas divisum   ? ? ?Patient Active Problem List  ? Diagnosis Date Noted  ? History of pancreatitis   ? Migraine   ? Chest pain 01/07/2020  ? H/O: hysterectomy 03/22/2018  ? ? ?Past Surgical History:  ?Procedure Laterality Date  ? ABDOMINAL HYSTERECTOMY    ? NASAL SINUS SURGERY    ? ? ?OB History   ? ? Gravida  ?3  ? Para  ?3  ? Term  ?   ? Preterm  ?   ? AB  ?   ? Living  ?   ?  ? ? SAB  ?   ? IAB  ?   ? Ectopic  ?   ? Multiple  ?   ? Live Births  ?   ?   ?  ?  ? ? ? ?Home Medications   ? ?Prior to Admission medications   ?Medication Sig Start Date End Date Taking? Authorizing Provider  ?eletriptan (RELPAX) 40 MG tablet Take 40 mg by mouth as needed for migraine or headache. May repeat in 2 hours if headache persists or recurs.   Yes [provider]  ?esomeprazole (NEXIUM) 40 MG capsule Take 1 capsule (40 mg total) by mouth daily. 07/01/18  Yes Melynda Ripple, MD  ?fexofenadine (ALLEGRA) 180 MG tablet Take 180 mg by mouth daily.   Yes [provider]  ?fluticasone (FLONASE) 50 MCG/ACT nasal spray Place 2 sprays into both nostrils daily as needed for allergies or rhinitis.    Yes [provider]  ?Magnesium Oxide 250 MG TABS Take 250 mg by mouth daily.    Yes [provider]  ?meclizine (ANTIVERT) 25 MG tablet Take 1 tablet (25 mg total) by mouth 3 (three) times daily as needed for dizziness. 06/08/21  Yes Margarette Canada, NP  ?Multiple Vitamins-Minerals (ONE-A-DAY 50 PLUS PO) Take 1 tablet by mouth daily.    Yes [provider]  ?ondansetron (ZOFRAN-ODT) 8 MG disintegrating tablet Take 8 mg by mouth every 8 (eight) hours as needed for nausea or vomiting.   Yes [provider]  ?PAPAYA PO Take by mouth. Prn for pancreas   Yes [provider]  ?ranolazine (RANEXA) 500 MG 12 hr tablet TAKE 1 TABLET BY MOUTH TWICE A DAY 06/06/21  Yes Gollan, Kathlene November,  MD  ?rosuvastatin (CRESTOR) 5 MG tablet Take by mouth. 01/31/21 01/31/22 Yes [provider]  ?spironolactone (ALDACTONE) 25 MG tablet Take 0.5 tablets (12.5 mg total) by mouth daily. 04/05/21 11/21/21 Yes Furth, Cadence H, PA-C  ?thiamine 100 MG tablet Take by mouth.   Yes [provider]  ?topiramate (TOPAMAX) 100 MG tablet Take 100 mg by mouth 2 (two) times daily.   Yes [provider]  ?zinc sulfate 220 (50 Zn) MG capsule Take by mouth.   Yes [provider]  ?montelukast (SINGULAIR) 10 MG tablet Take by mouth. 12/31/20   [provider]  ?nitroGLYCERIN (NITROSTAT) 0.4 MG SL tablet Place 1 tablet (0.4 mg total) under the tongue every 5 (five) minutes as needed for chest pain. 01/08/20   Nolberto Hanlon, MD  ?amLODipine (NORVASC) 2.5 MG tablet Take 1 tablet (2.5 mg total) by mouth daily. 01/26/20 02/28/20  Loel Dubonnet, NP  ? ? ?Family History ?Family History  ?Problem Relation Age of Onset  ? Heart failure Mother   ? Hypertension Mother   ? Esophageal cancer Mother   ? Heart failure Father   ? Hypertension Father   ? Breast cancer Maternal Aunt 35  ? Breast cancer Maternal Grandmother 63  ? Breast cancer Cousin 20  ? ? ?Social History ?Social History  ? ?Tobacco Use  ? Smoking status: Never  ? Smokeless tobacco: Never  ?Vaping Use  ? Vaping Use: Never used  ?Substance Use Topics  ? Alcohol use: No  ? Drug use: Never  ? ? ? ?Allergies   ?Hctz [hydrochlorothiazide], Potassium, Potassium-containing compounds, Imitrex [sumatriptan], and Percocet [oxycodone-acetaminophen] ? ? ?Review of Systems ?Review of Systems ?Defer to HPI  ? ? ?Physical Exam ?Triage Vital Signs ?ED Triage Vitals  ?Enc Vitals Group  ?   BP 11/21/21 1250 (!) 144/76  ?   Pulse Rate 11/21/21 1250 88  ?   Resp 11/21/21 1250 18  ?   Temp 11/21/21 1250 98.4 ?F (36.9 ?C)  ?   Temp Source 11/21/21 1250 Oral  ?   SpO2 11/21/21 1250 100 %  ?   Weight 11/21/21 1246 182 lb 15.7 oz (83 kg)  ?   Height 11/21/21 1246 '5\' 8"'$  (1.727 m)  ?   Head Circumference --   ?   Peak Flow --   ?   Pain Score 11/21/21 1245 0  ?   Pain Loc --   ?   Pain Edu? --   ?   Excl. in [place]? --   ? ?No data found. ? ?Updated Vital Signs ?BP (!) 144/76 (BP Location: Right Arm)   Pulse 88   Temp 98.4 ?F (36.9 ?C) (Oral)   Resp 18   Ht '5\' 8"'$  (1.727 m)   Wt 182 lb 15.7 oz (83 kg)   SpO2 100%   BMI 27.82 kg/m?  ? ?Visual Acuity ?Right Eye Distance:   ?Left Eye Distance:   ?Bilateral Distance:   ? ?Right Eye Near:   ?Left Eye Near:    ?Bilateral Near:    ? ?Physical Exam ?Constitutional:   ?   Appearance: Normal appearance.  ?HENT:  ?   Head: Normocephalic.  ?Eyes:  ?   Extraocular Movements: Extraocular movements intact.  ?   Pupils: Pupils are equal, round, and reactive to light.  ?Pulmonary:  ?   Effort: Pulmonary effort is normal.  ?Skin: ?   General: Skin is warm and dry.  ?Neurological:  ?  Mental Status: She is alert and oriented to person, place, and time. Mental status is at baseline.  ?   Cranial Nerves: No cranial nerve deficit.  ?   Sensory: No sensory deficit.  ?   Motor: No weakness.  ?   Gait: Gait normal.  ?Psychiatric:     ?   Mood and Affect: Mood normal.     ?   Behavior: Behavior normal.  ? ? ? ?UC Treatments / Results  ?Labs ?(all labs ordered are listed, but only abnormal results are displayed) ?Labs Reviewed - No data to display ? ?EKG ? ? ?Radiology ?No results found. ? ?Procedures ?Procedures (including critical care time) ? ?Medications Ordered in UC ?Medications - No data to display ? ?Initial Impression / Assessment and Plan / UC Course  ?I have reviewed the triage vital signs and the nursing notes. ? ?Pertinent labs & imaging results that were available during my care of the patient were reviewed by me and considered in my medical decision making (see chart for details). ? ?Dizziness ?Lightheadedness ?Diaphoresis ?Weakness ? ?Etiology of symptoms are unknown at this time, discussed with patient and family member, vital signs are stable and patient is in no signs of distress, we will complete basic labs to rule out organic cause, labs pending, EKG showing normal sinus rhythm with regular pace, point-of-care CBG 96, neurological exam without abnormality, if all testing today negative patient to follow-up with primary care doctor in 1 week for reevaluation of symptoms for any worsening symptoms patient given strict precautions to go to the nearest emergency department for further evaluation and management ?Final diagnoses:  ?None  ? ?Discharge Instructions   ?None ?  ? ?ED Prescriptions   ?None ?  ? ?PDMP not reviewed this encounter. ?  ?Hans Eden, NP ?11/21/21 1513 ? ?

## 2021-11-24 ENCOUNTER — Telehealth: Payer: Self-pay | Admitting: Cardiovascular Disease

## 2021-11-24 NOTE — Telephone Encounter (Signed)
? ?  Pre-operative Risk Assessment  ?  ?Patient Name: Margaret Bryan  ?DOB: 04/21/1961 ?MRN: [id number]  ? ?  ? ?Request for Surgical Clearance   ? ?Procedure:   Cataract Extraction by PE, IOL - right then left  ? ?Date of Surgery:  Clearance 12/09/21                              ?   ?Surgeon:  Dr Tama High ?Surgeon's Group or Practice Name:  [facility]  ?Phone number:  [phone] ?Fax number:  [phone] ?  ?Type of Clearance Requested:   ?- Medical  ?  ?Type of Anesthesia:   IV sedation  ?  ?Additional requests/questions:   ? ?Signed, ?Caryl Pina Gerringer   ?11/24/2021, 3:01 PM  ? ?

## 2021-11-25 NOTE — Telephone Encounter (Signed)
? ?  Patient Name: Margaret Bryan  ?DOB: 1960-12-23 ?MRN: [id number] ? ?Primary Cardiologist: Ida Rogue, MD ? ?Chart reviewed as part of pre-operative protocol coverage. Cataract extractions are recognized in guidelines as low risk surgeries that do not typically require specific preoperative testing or holding of blood thinner therapy. Therefore, given past medical history and time since last visit, based on ACC/AHA guidelines, Koby Hartfield would be at acceptable risk for the planned procedure without further cardiovascular testing.  ? ?I will route this recommendation to the requesting party via Epic fax function and remove from pre-op pool. ? ?Please call with questions. ? ?Deberah Pelton, NP ?11/25/2021, 9:14 AM ? ?

## 2022-02-08 ENCOUNTER — Other Ambulatory Visit (HOSPITAL_COMMUNITY)
Admission: RE | Admit: 2022-02-08 | Discharge: 2022-02-08 | Disposition: A | Discharge status: Home / Self Care | Payer: BC Managed Care – PPO | Source: Ambulatory Visit | Attending: Certified Nurse Midwife | Admitting: Certified Nurse Midwife

## 2022-02-08 ENCOUNTER — Encounter: Payer: Self-pay | Admitting: Certified Nurse Midwife

## 2022-02-08 ENCOUNTER — Ambulatory Visit (INDEPENDENT_AMBULATORY_CARE_PROVIDER_SITE_OTHER): Payer: BC Managed Care – PPO | Admitting: Certified Nurse Midwife

## 2022-02-08 VITALS — BP 120/79 | HR 80 | Ht 68.0 in | Wt 180.2 lb

## 2022-02-08 DIAGNOSIS — Z124 Encounter for screening for malignant neoplasm of cervix: Secondary | ICD-10-CM | POA: Insufficient documentation

## 2022-02-08 DIAGNOSIS — N941 Unspecified dyspareunia: Secondary | ICD-10-CM | POA: Insufficient documentation

## 2022-02-08 DIAGNOSIS — Z01419 Encounter for gynecological examination (general) (routine) without abnormal findings: Secondary | ICD-10-CM | POA: Insufficient documentation

## 2022-02-08 DIAGNOSIS — Z1231 Encounter for screening mammogram for malignant neoplasm of breast: Secondary | ICD-10-CM

## 2022-02-08 NOTE — Progress Notes (Signed)
GYNECOLOGY ANNUAL PREVENTATIVE CARE ENCOUNTER NOTE  History:     Natsuko Kelsay is a 61 y.o. G3P3 female here for a routine annual gynecologic exam.  Current complaints: pain with intercourse and difficulty reaching orgasm .   Denies abnormal vaginal bleeding, discharge, pelvic pain, problems with intercourse or other gynecologic concerns.     Social Relationship: married Living:spouse and daughter Work: zoning Horticulturist, commercial  Exercise: 2x wk Smoke/Alcohol/drug use: denies use   Gynecologic History No LMP recorded. Patient has had a hysterectomy. Contraception: status post hysterectomy Last Pap: 03/30/2018. Results were: normal with negative HPV Last mammogram: 09/27/2020. Results were: normal  Obstetric History OB History  Gravida Para Term Preterm AB Living  3 3          SAB IAB Ectopic Multiple Live Births               # Outcome Date GA Lbr Len/2nd Weight Sex Delivery Anes PTL Lv  3 Para 1995    F Vag-Spont     2 Para 1992    F Vag-Spont     1 Para 1989    F Vag-Spont  N     Past Medical History:  Diagnosis Date   Cancer ([place])    skin ca   GERD (gastroesophageal reflux disease)    Heart murmur    Migraine    Pancreas divisum     Past Surgical History:  Procedure Laterality Date   ABDOMINAL HYSTERECTOMY     NASAL SINUS SURGERY      Current Outpatient Medications on File Prior to Visit  Medication Sig Dispense Refill   eletriptan (RELPAX) 40 MG tablet Take 40 mg by mouth as needed for migraine or headache. May repeat in 2 hours if headache persists or recurs.     esomeprazole (NEXIUM) 40 MG capsule Take 1 capsule (40 mg total) by mouth daily. 30 capsule 0   fexofenadine (ALLEGRA) 180 MG tablet Take 180 mg by mouth daily.     fluticasone (FLONASE) 50 MCG/ACT nasal spray Place 2 sprays into both nostrils daily as needed for allergies or rhinitis.      Magnesium Oxide 250 MG TABS Take 250 mg by mouth daily.      meclizine (ANTIVERT) 25 MG tablet Take 1 tablet  (25 mg total) by mouth 3 (three) times daily as needed for dizziness. 30 tablet 0   montelukast (SINGULAIR) 10 MG tablet Take by mouth.     Multiple Vitamins-Minerals (ONE-A-DAY 50 PLUS PO) Take 1 tablet by mouth daily.      nitroGLYCERIN (NITROSTAT) 0.4 MG SL tablet Place 1 tablet (0.4 mg total) under the tongue every 5 (five) minutes as needed for chest pain. 30 tablet 0   ondansetron (ZOFRAN-ODT) 8 MG disintegrating tablet Take 8 mg by mouth every 8 (eight) hours as needed for nausea or vomiting.     PAPAYA PO Take by mouth. Prn for pancreas     ranolazine (RANEXA) 500 MG 12 hr tablet TAKE 1 TABLET BY MOUTH TWICE A DAY 180 tablet 2   rosuvastatin (CRESTOR) 5 MG tablet Take by mouth.     spironolactone (ALDACTONE) 25 MG tablet Take 0.5 tablets (12.5 mg total) by mouth daily. 45 tablet 3   thiamine 100 MG tablet Take by mouth.     topiramate (TOPAMAX) 100 MG tablet Take 100 mg by mouth 2 (two) times daily.     zinc sulfate 220 (50 Zn) MG capsule Take by mouth.     [  DISCONTINUED] amLODipine (NORVASC) 2.5 MG tablet Take 1 tablet (2.5 mg total) by mouth daily. 90 tablet 3   No current facility-administered medications on file prior to visit.    Allergies  Allergen Reactions   Hctz [Hydrochlorothiazide] Other (See Comments)    Nausea, blistering (feet, hands), tingling of the feet.   Potassium Other (See Comments)    Nausea, blistering (feet, hands), tingling of the feet.   Potassium-Containing Compounds Other (See Comments)    Nausea, blistering (feet, hands), tingling of the feet.   Imitrex [Sumatriptan]    Percocet [Oxycodone-Acetaminophen] Other (See Comments)    Hallucinations, rapid heartbeat    Social History:  reports that she has never smoked. She has never used smokeless tobacco. She reports that she does not drink alcohol and does not use drugs.  Family History  Problem Relation Age of Onset   Heart failure Mother    Hypertension Mother    Esophageal cancer Mother     Heart failure Father    Hypertension Father    Breast cancer Maternal Aunt 65   Breast cancer Maternal Grandmother 28   Breast cancer Cousin 38    The following portions of the patient's history were reviewed and updated as appropriate: allergies, current medications, past family history, past medical history, past social history, past surgical history and problem list.  Review of Systems Pertinent items noted in HPI and remainder of comprehensive ROS otherwise negative.  Physical Exam:  There were no vitals taken for this visit. CONSTITUTIONAL: Well-developed, well-nourished female in no acute distress.  HENT:  Normocephalic, atraumatic, External right and left ear normal. Oropharynx is clear and moist EYES: Conjunctivae and EOM are normal. Pupils are equal, round, and reactive to light. No scleral icterus.  NECK: Normal range of motion, supple, no masses.  Normal thyroid.  SKIN: Skin is warm and dry. No rash noted. Not diaphoretic. No erythema. No pallor. MUSCULOSKELETAL: Normal range of motion. No tenderness.  No cyanosis, clubbing, or edema.  2+ distal pulses. NEUROLOGIC: Alert and oriented to person, place, and time. Normal reflexes, muscle tone coordination.  PSYCHIATRIC: Normal mood and affect. Normal behavior. Normal judgment and thought content. CARDIOVASCULAR: Normal heart rate noted, regular rhythm RESPIRATORY: Clear to auscultation bilaterally. Effort and breath sounds normal, no problems with respiration noted. BREASTS: Symmetric in size. No masses, tenderness, skin changes, nipple drainage, or lymphadenopathy bilaterally.  ABDOMEN: Soft, no distention noted.  No tenderness, rebound or guarding.  PELVIC: Normal appearing external genitalia and urethral meatus; normal appearing vaginal mucosa and vaginal cuff. No abnormal discharge noted.  Pap smear obtained of vaginal tissue.  Uterus absent,  no other palpable masses, or adnexal tenderness.  .   Assessment and Plan:    1.  Women's annual routine gynecological examination   Pap: Will follow up results of pap smear and manage accordingly. Mammogram : ordered Labs: none Refills: none Referral: none Pt declines any medication with hormones or similar to hormones due to history of skin cancer. Discussed use of lubrication and toys to help with orgasmic.  Routine preventative health maintenance measures emphasized. Please refer to After Visit Summary for other counseling recommendations.      Philip Aspen, CNM [facility]

## 2022-02-09 LAB — CYTOLOGY - PAP
Comment: NEGATIVE
Diagnosis: NEGATIVE
High risk HPV: NEGATIVE

## 2022-02-28 ENCOUNTER — Ambulatory Visit (INDEPENDENT_AMBULATORY_CARE_PROVIDER_SITE_OTHER): Payer: BC Managed Care – PPO

## 2022-02-28 ENCOUNTER — Ambulatory Visit (INDEPENDENT_AMBULATORY_CARE_PROVIDER_SITE_OTHER): Payer: BC Managed Care – PPO | Admitting: Podiatry

## 2022-02-28 DIAGNOSIS — R601 Generalized edema: Secondary | ICD-10-CM

## 2022-02-28 DIAGNOSIS — M778 Other enthesopathies, not elsewhere classified: Secondary | ICD-10-CM

## 2022-02-28 NOTE — Progress Notes (Signed)
Subjective:  Patient ID: Margaret Bryan, female    DOB: [birth year],  MRN: [id number]  No chief complaint on file.   61 y.o. female presents with the above complaint.  Patient presents with complaint of swelling to right lower extremity.  She has a history of skin cancer for which the cancer was excised out completely.  She states the swelling started right after.  She states it causes her a lot of pain on top of her foot as well.  She wanted to get it evaluated for swelling and pain on top of her foot.  She has not seen anyone else prior to seeing me.  Pain scale is 6 out of 10.  It hurts with ambulation when she is on her foot as well as when she is resting at night.  She denies any other acute issues   Review of Systems: Negative except as noted in the HPI. Denies N/V/F/Ch.  Past Medical History:  Diagnosis Date   Cancer ([place])    skin ca   GERD (gastroesophageal reflux disease)    Heart murmur    Migraine    Pancreas divisum     Current Outpatient Medications:    eletriptan (RELPAX) 40 MG tablet, Take 40 mg by mouth as needed for migraine or headache. May repeat in 2 hours if headache persists or recurs., Disp: , Rfl:    esomeprazole (NEXIUM) 40 MG capsule, Take 1 capsule (40 mg total) by mouth daily., Disp: 30 capsule, Rfl: 0   fexofenadine (ALLEGRA) 180 MG tablet, Take 180 mg by mouth daily., Disp: , Rfl:    fluticasone (FLONASE) 50 MCG/ACT nasal spray, Place 2 sprays into both nostrils daily as needed for allergies or rhinitis. , Disp: , Rfl:    Magnesium Oxide 250 MG TABS, Take 250 mg by mouth daily. , Disp: , Rfl:    meclizine (ANTIVERT) 25 MG tablet, Take 1 tablet (25 mg total) by mouth 3 (three) times daily as needed for dizziness., Disp: 30 tablet, Rfl: 0   montelukast (SINGULAIR) 10 MG tablet, Take by mouth., Disp: , Rfl:    Multiple Vitamins-Minerals (ONE-A-DAY 50 PLUS PO), Take 1 tablet by mouth daily. , Disp: , Rfl:    nitroGLYCERIN (NITROSTAT) 0.4 MG SL tablet, Place 1  tablet (0.4 mg total) under the tongue every 5 (five) minutes as needed for chest pain., Disp: 30 tablet, Rfl: 0   ondansetron (ZOFRAN-ODT) 8 MG disintegrating tablet, Take 8 mg by mouth every 8 (eight) hours as needed for nausea or vomiting., Disp: , Rfl:    PAPAYA PO, Take by mouth. Prn for pancreas, Disp: , Rfl:    ranolazine (RANEXA) 500 MG 12 hr tablet, TAKE 1 TABLET BY MOUTH TWICE A DAY, Disp: 180 tablet, Rfl: 2   rosuvastatin (CRESTOR) 5 MG tablet, Take by mouth., Disp: , Rfl:    spironolactone (ALDACTONE) 25 MG tablet, Take 0.5 tablets (12.5 mg total) by mouth daily., Disp: 45 tablet, Rfl: 3   thiamine 100 MG tablet, Take by mouth., Disp: , Rfl:    topiramate (TOPAMAX) 100 MG tablet, Take 100 mg by mouth 2 (two) times daily., Disp: , Rfl:    zinc sulfate 220 (50 Zn) MG capsule, Take by mouth., Disp: , Rfl:   Social History   Tobacco Use  Smoking Status Never  Smokeless Tobacco Never    Allergies  Allergen Reactions   Hctz [Hydrochlorothiazide] Other (See Comments)    Nausea, blistering (feet, hands), tingling of the feet.   Potassium  Other (See Comments)    Nausea, blistering (feet, hands), tingling of the feet.   Potassium-Containing Compounds Other (See Comments)    Nausea, blistering (feet, hands), tingling of the feet.   Imitrex [Sumatriptan]    Percocet [Oxycodone-Acetaminophen] Other (See Comments)    Hallucinations, rapid heartbeat   Objective:  There were no vitals filed for this visit. There is no height or weight on file to calculate BMI. Constitutional Well developed. Well nourished.  Vascular Dorsalis pedis pulses palpable bilaterally. Posterior tibial pulses palpable bilaterally. Capillary refill normal to all digits.  No cyanosis or clubbing noted. Pedal hair growth normal.  Neurologic Normal speech. Oriented to person, place, and time. Epicritic sensation to light touch grossly present bilaterally.  Dermatologic Nails well groomed and normal in  appearance. No open wounds. No skin lesions.  Orthopedic: 1+ pitting edema noted to right lower extremity.  No open wounds or lesion noted.  Pain on palpation right dorsal midfoot.  Pain with palpation.  Swelling noted circumferential around it.  No acute complaints.  Motor or sensory functions are intact.   Radiographs: 3 views of skeletally mature the right foot: Midfoot arthritis noted.  Plantar and posterior heel spurring noted.  Mild first metatarsophalangeal joint arthritis noted with a bunion deformity moderate.  Pes cavus foot structure noted Assessment:   1. Capsulitis of right foot   2. Generalized edema    Plan:  Patient was evaluated and treated and all questions answered.  Right dorsal midfoot capsulitis with underlying edema -All questions concerns were discussed with the patient in extensive detail.  I discussed shoe gear modification briefly.  I discussed that she will benefit from a steroid injection and can also help with the swelling as well.  She states understand like to proceed with steroid injection -A steroid injection was performed at right dorsal midfoot a point of maximal tenderness using 1% plain Lidocaine and 10 mg of Kenalog. This was well tolerated. -Compression socks and elevation was discussed   No follow-ups on file.

## 2022-03-20 ENCOUNTER — Ambulatory Visit: Payer: BC Managed Care – PPO | Admitting: Cardiovascular Disease

## 2022-03-22 ENCOUNTER — Other Ambulatory Visit: Payer: Self-pay | Admitting: Medical

## 2022-03-26 ENCOUNTER — Other Ambulatory Visit: Payer: Self-pay | Admitting: Medical

## 2022-03-28 ENCOUNTER — Ambulatory Visit (INDEPENDENT_AMBULATORY_CARE_PROVIDER_SITE_OTHER): Payer: BC Managed Care – PPO | Admitting: Podiatry

## 2022-03-28 ENCOUNTER — Encounter: Payer: Self-pay | Admitting: Podiatry

## 2022-03-28 DIAGNOSIS — M778 Other enthesopathies, not elsewhere classified: Secondary | ICD-10-CM | POA: Diagnosis not present

## 2022-03-28 NOTE — Progress Notes (Signed)
Subjective:  Patient ID: Margaret Bryan, female    DOB: 09-04-1961,  MRN: [id number]  Chief Complaint  Patient presents with   Foot Pain    "It's a little bit better, definitely not as swollen."    61 y.o. female presents with the above complaint.  Patient presents with follow-up of right dorsal midfoot pain.  She states the injection helped considerably.  She still has some residual pain.  Pain scale is 5 out of 10 hurts with ambulation but much better than before.  Is not as swollen.   Review of Systems: Negative except as noted in the HPI. Denies N/V/F/Ch.  Past Medical History:  Diagnosis Date   Cancer ([place])    skin ca   GERD (gastroesophageal reflux disease)    Heart murmur    Migraine    Pancreas divisum     Current Outpatient Medications:    eletriptan (RELPAX) 40 MG tablet, Take 40 mg by mouth as needed for migraine or headache. May repeat in 2 hours if headache persists or recurs., Disp: , Rfl:    esomeprazole (NEXIUM) 40 MG capsule, Take 1 capsule (40 mg total) by mouth daily., Disp: 30 capsule, Rfl: 0   fexofenadine (ALLEGRA) 180 MG tablet, Take 180 mg by mouth daily., Disp: , Rfl:    fluticasone (FLONASE) 50 MCG/ACT nasal spray, Place 2 sprays into both nostrils daily as needed for allergies or rhinitis. , Disp: , Rfl:    Magnesium Oxide 250 MG TABS, Take 250 mg by mouth daily. , Disp: , Rfl:    meclizine (ANTIVERT) 25 MG tablet, Take 1 tablet (25 mg total) by mouth 3 (three) times daily as needed for dizziness., Disp: 30 tablet, Rfl: 0   montelukast (SINGULAIR) 10 MG tablet, Take by mouth., Disp: , Rfl:    Multiple Vitamins-Minerals (ONE-A-DAY 50 PLUS PO), Take 1 tablet by mouth daily. , Disp: , Rfl:    nitroGLYCERIN (NITROSTAT) 0.4 MG SL tablet, Place 1 tablet (0.4 mg total) under the tongue every 5 (five) minutes as needed for chest pain., Disp: 30 tablet, Rfl: 0   ondansetron (ZOFRAN-ODT) 8 MG disintegrating tablet, Take 8 mg by mouth every 8 (eight) hours as needed  for nausea or vomiting., Disp: , Rfl:    PAPAYA PO, Take by mouth. Prn for pancreas, Disp: , Rfl:    ranolazine (RANEXA) 500 MG 12 hr tablet, TAKE 1 TABLET BY MOUTH TWICE A DAY, Disp: 180 tablet, Rfl: 2   rosuvastatin (CRESTOR) 5 MG tablet, Take by mouth., Disp: , Rfl:    spironolactone (ALDACTONE) 25 MG tablet, Take 0.5 tablets (12.5 mg total) by mouth daily., Disp: 45 tablet, Rfl: 3   thiamine 100 MG tablet, Take by mouth., Disp: , Rfl:    topiramate (TOPAMAX) 100 MG tablet, Take 100 mg by mouth 2 (two) times daily., Disp: , Rfl:    zinc sulfate 220 (50 Zn) MG capsule, Take by mouth., Disp: , Rfl:   Social History   Tobacco Use  Smoking Status Never  Smokeless Tobacco Never    Allergies  Allergen Reactions   Hctz [Hydrochlorothiazide] Other (See Comments)    Nausea, blistering (feet, hands), tingling of the feet.   Potassium Other (See Comments)    Nausea, blistering (feet, hands), tingling of the feet.   Potassium-Containing Compounds Other (See Comments)    Nausea, blistering (feet, hands), tingling of the feet.   Imitrex [Sumatriptan]    Percocet [Oxycodone-Acetaminophen] Other (See Comments)    Hallucinations, rapid heartbeat  Objective:  There were no vitals filed for this visit. There is no height or weight on file to calculate BMI. Constitutional Well developed. Well nourished.  Vascular Dorsalis pedis pulses palpable bilaterally. Posterior tibial pulses palpable bilaterally. Capillary refill normal to all digits.  No cyanosis or clubbing noted. Pedal hair growth normal.  Neurologic Normal speech. Oriented to person, place, and time. Epicritic sensation to light touch grossly present bilaterally.  Dermatologic Nails well groomed and normal in appearance. No open wounds. No skin lesions.  Orthopedic: 1+ pitting edema noted to right lower extremity.  No open wounds or lesion noted.  Pain on palpation right dorsal midfoot.  Pain with palpation.  Swelling noted  circumferential around it.  No acute complaints.  Motor or sensory functions are intact.   Radiographs: 3 views of skeletally mature the right foot: Midfoot arthritis noted.  Plantar and posterior heel spurring noted.  Mild first metatarsophalangeal joint arthritis noted with a bunion deformity moderate.  Pes cavus foot structure noted Assessment:   1. Capsulitis of right foot     Plan:  Patient was evaluated and treated and all questions answered.  Right dorsal midfoot capsulitis with underlying edema -All questions concerns were discussed with the patient in extensive detail.  I discussed shoe gear modification briefly.  I discussed that she will benefit from a steroid injection and can also help with the swelling as well.  She states understand like to proceed with steroid injection -A second steroid injection was performed at right dorsal midfoot a point of maximal tenderness using 1% plain Lidocaine and 10 mg of Kenalog. This was well tolerated. -Compression socks and elevation was discussed   No follow-ups on file.

## 2022-06-12 NOTE — Progress Notes (Signed)
Date:  06/13/2022   ID:  Margaret Bryan, DOB 12/07/1960, MRN [id number]  Patient Location:  [place] [address]   Provider location:   [facility], [place] office  PCP:  [name], MD  Cardiologist:  Arvid Right [facility]  Chief Complaint  Patient presents with   12 month follow up     Patient c/o chest pain and shortness of breath. Medications reviewed by the patient verbally.     History of Present Illness:    Margaret Bryan is a 61 y.o. female  past medical history of  chronic pancreatitis, hyperlipidemia  presenting to the hospital January 08, 2020 with chest pain concerning for angina Cardiac CTA May 2021 no significant coronary disease, calcium score 0 Who presents for follow-up of her chest pain  Last seen by virtual visit May 2021 Seen by one of our providers July 2022 On prior clinic visits in 2022 reported having lower extremity edema Transitioned off amlodipine Had side effects on HCTZ Was given trial of Lasix with potassium Transition to spironolactone which has worked well for her  Reports no significant leg swelling, Has chronic bruising  Chest discomfort better on Ranexa   non-smoker, no diabetes Blood pressure stable Tolerating Lipitor 10, cholesterol 250 down to 190  EKG personally reviewed by myself on todays visit Normal sinus rhythm with rate 68 bpm no significant ST-T wave changes   Past medical history reviewed Previous episodes of significant left-sided chest pain radiating to her left jaw left arm Started overnight, woke up in the morning with recurrent symptoms, presented to the fire department EKG nonacute in the hospital Cardiac enzymes negative Markedly hypertensive at EMTs/fire department, high blood pressure persisted for several hours   Cardiac CTA Jan 22, 2020 1. Coronary calcium score of 0. Patient is low risk for near term coronary events 2. Normal coronary origin with right  dominance. 3. No evidence of CAD. 4.CAD-RADS 0. Consider non-atherosclerotic causes of chest pain   Prior CV studies:   The following studies were reviewed today:    Past Medical History:  Diagnosis Date   Cancer ([place])    skin ca   GERD (gastroesophageal reflux disease)    Heart murmur    Migraine    Pancreas divisum    Past Surgical History:  Procedure Laterality Date   ABDOMINAL HYSTERECTOMY     NASAL SINUS SURGERY        Allergies:   Iodine, Hctz [hydrochlorothiazide], Potassium, Potassium-containing compounds, Gadobenate, Imitrex [sumatriptan], and Percocet [oxycodone-acetaminophen]   Social History   Tobacco Use   Smoking status: Never   Smokeless tobacco: Never  Vaping Use   Vaping Use: Never used  Substance Use Topics   Alcohol use: No   Drug use: Never     Current Outpatient Medications on File Prior to Visit  Medication Sig Dispense Refill   atorvastatin (LIPITOR) 10 MG tablet Take 10 mg by mouth daily.     eletriptan (RELPAX) 40 MG tablet Take 40 mg by mouth as needed for migraine or headache. May repeat in 2 hours if headache persists or recurs.     esomeprazole (NEXIUM) 40 MG capsule Take 1 capsule (40 mg total) by mouth daily. 30 capsule 0   fexofenadine (ALLEGRA) 180 MG tablet Take 180 mg by mouth daily.     fluticasone (FLONASE) 50 MCG/ACT nasal spray Place 2 sprays into both nostrils daily as needed for allergies or rhinitis.  Magnesium Oxide 250 MG TABS Take 250 mg by mouth daily.      meclizine (ANTIVERT) 25 MG tablet Take 1 tablet (25 mg total) by mouth 3 (three) times daily as needed for dizziness. 30 tablet 0   montelukast (SINGULAIR) 10 MG tablet Take by mouth.     Multiple Vitamins-Minerals (ONE-A-DAY 50 PLUS PO) Take 1 tablet by mouth daily.      nitroGLYCERIN (NITROSTAT) 0.4 MG SL tablet Place 1 tablet (0.4 mg total) under the tongue every 5 (five) minutes as needed for chest pain. 30 tablet 0   ondansetron (ZOFRAN-ODT) 8 MG  disintegrating tablet Take 8 mg by mouth every 8 (eight) hours as needed for nausea or vomiting.     PAPAYA PO Take by mouth. Prn for pancreas     ranolazine (RANEXA) 500 MG 12 hr tablet TAKE 1 TABLET BY MOUTH TWICE A DAY 180 tablet 2   spironolactone (ALDACTONE) 25 MG tablet TAKE 1/2 TABLET BY MOUTH EVERY DAY 45 tablet 1   thiamine 100 MG tablet Take by mouth.     topiramate (TOPAMAX) 100 MG tablet Take 100 mg by mouth 2 (two) times daily.     zinc sulfate 220 (50 Zn) MG capsule Take by mouth.     [DISCONTINUED] amLODipine (NORVASC) 2.5 MG tablet Take 1 tablet (2.5 mg total) by mouth daily. 90 tablet 3   No current facility-administered medications on file prior to visit.     Family Hx: The patient's family history includes Breast cancer (age of onset: 61) in her cousin; Breast cancer (age of onset: 17) in her maternal aunt; Breast cancer (age of onset: 53) in her maternal grandmother; Esophageal cancer in her mother; Heart failure in her father and mother; Hypertension in her father and mother.  ROS:   Please see the history of present illness.    Review of Systems  Constitutional: Negative.   HENT: Negative.    Respiratory: Negative.    Cardiovascular:  Positive for chest pain.  Gastrointestinal: Negative.   Musculoskeletal: Negative.   Neurological: Negative.   Psychiatric/Behavioral: Negative.    All other systems reviewed and are negative.     Labs/Other Tests and Data Reviewed:    Recent Labs: 11/21/2021: ALT 16; BUN 19; Creatinine, Ser 0.68; Hemoglobin 13.7; Platelets 251; Potassium 3.9; Sodium 135; TSH 2.281   Recent Lipid Panel No results found for: "CHOL", "TRIG", "HDL", "CHOLHDL", "LDLCALC", "LDLDIRECT"  Wt Readings from Last 3 Encounters:  06/13/22 180 lb 6 oz (81.8 kg)  02/08/22 180 lb 3.2 oz (81.7 kg)  11/21/21 182 lb 15.7 oz (83 kg)     Exam:    Vital Signs: Vital signs may also be detailed in the HPI BP 120/70 (BP Location: Left Arm, Patient Position:  Sitting, Cuff Size: Normal)   Pulse 68   Ht '5\' 8"'$  (1.727 m)   Wt 180 lb 6 oz (81.8 kg)   BMI 27.43 kg/m   Constitutional:  oriented to person, place, and time. No distress.  HENT:  Head: Grossly normal Eyes:  no discharge. No scleral icterus.  Neck: No JVD, no carotid bruits  Cardiovascular: Regular rate and rhythm, no murmurs appreciated Pulmonary/Chest: Clear to auscultation bilaterally, no wheezes or rails Abdominal: Soft.  no distension.  no tenderness.  Musculoskeletal: Normal range of motion Neurological:  normal muscle tone. Coordination normal. No atrophy Skin: Skin warm and dry Psychiatric: normal affect, pleasant   ASSESSMENT & PLAN:    Problem List Items Addressed This Visit  Chest pain   Relevant Orders   EKG 12-Lead   Other Visit Diagnoses     Essential hypertension    -  Primary   Relevant Medications   atorvastatin (LIPITOR) 10 MG tablet   Other Relevant Orders   EKG 12-Lead   Hyperlipidemia, unspecified hyperlipidemia type       Relevant Medications   atorvastatin (LIPITOR) 10 MG tablet   Leg swelling       Relevant Orders   EKG 12-Lead   DOE (dyspnea on exertion)       Relevant Orders   EKG 12-Lead     Atypical chest discomfort Unable to exclude coronary spasm Cardiac CTA discussed, no significant coronary disease Doing well on Ranexa, no further work-up needed  Hyperlipidemia Improved numbers on Lipitor 10  Leg swelling Improved with holding amlodipine, starting spironolactone 12.5 mg daily Potassium within normal range  Chronic pancreatitis Followed by GI, prior intervention at [facility] Has learned how to manage this   Total encounter time more than 30 minutes  Greater than 50% was spent in counseling and coordination of care with the patient   Signed, Ida Rogue, [place] [facility] [address], [place], [place] [address]

## 2022-06-13 ENCOUNTER — Ambulatory Visit: Payer: BC Managed Care – PPO | Attending: Cardiovascular Disease | Admitting: Cardiovascular Disease

## 2022-06-13 ENCOUNTER — Encounter: Payer: Self-pay | Admitting: Cardiovascular Disease

## 2022-06-13 VITALS — BP 120/70 | HR 68 | Ht 68.0 in | Wt 180.4 lb

## 2022-06-13 DIAGNOSIS — R0609 Other forms of dyspnea: Secondary | ICD-10-CM

## 2022-06-13 DIAGNOSIS — I10 Essential (primary) hypertension: Secondary | ICD-10-CM

## 2022-06-13 DIAGNOSIS — M7989 Other specified soft tissue disorders: Secondary | ICD-10-CM | POA: Diagnosis not present

## 2022-06-13 DIAGNOSIS — R079 Chest pain, unspecified: Secondary | ICD-10-CM | POA: Diagnosis not present

## 2022-06-13 DIAGNOSIS — E785 Hyperlipidemia, unspecified: Secondary | ICD-10-CM | POA: Diagnosis not present

## 2022-06-13 MED ORDER — RANOLAZINE ER 500 MG PO TB12: 500.0000 mg | ORAL_TABLET | Freq: Two times a day (BID) | ORAL | 3 refills | Status: DC

## 2022-06-13 MED ORDER — SPIRONOLACTONE 25 MG PO TABS: 12.5000 mg | ORAL_TABLET | Freq: Every day | ORAL | 3 refills | Status: DC

## 2022-06-13 NOTE — Patient Instructions (Signed)
Medication Instructions:  No changes  If you need a refill on your cardiac medications before your next appointment, please call your pharmacy.   Lab work: No new labs needed  Testing/Procedures: No new testing needed  Follow-Up: At [facility], you and your health needs are our priority.  As part of our continuing mission to provide you with exceptional heart care, we have created designated Provider Care Teams.  These Care Teams include your primary Cardiologist (physician) and Advanced Practice Providers (APPs -  Physician Assistants and Nurse Practitioners) who all work together to provide you with the care you need, when you need it.  You will need a follow up appointment in 12 months  Providers on your designated Care Team:   Christopher Berge, NP Ryan Dunn, PA-C Cadence Furth, PA-C  COVID-19 Vaccine Information can be found at: [email or web address] For questions related to vaccine distribution or appointments, please email [email or web address] or call [phone].   

## 2022-07-24 ENCOUNTER — Other Ambulatory Visit: Payer: Self-pay | Admitting: Certified Nurse Midwife

## 2022-07-24 ENCOUNTER — Encounter: Payer: Self-pay | Admitting: Certified Nurse Midwife

## 2022-07-24 MED ORDER — NITROFURANTOIN MONOHYD MACRO 100 MG PO CAPS: 100.0000 mg | ORAL_CAPSULE | Freq: Once | ORAL | 3 refills | Status: DC | PRN

## 2022-08-28 ENCOUNTER — Ambulatory Visit
Admission: RE | Admit: 2022-08-28 | Discharge: 2022-08-28 | Disposition: A | Discharge status: Home / Self Care | Payer: BC Managed Care – PPO | Source: Ambulatory Visit | Attending: Certified Nurse Midwife | Admitting: Certified Nurse Midwife

## 2022-08-28 DIAGNOSIS — Z1231 Encounter for screening mammogram for malignant neoplasm of breast: Secondary | ICD-10-CM | POA: Insufficient documentation

## 2022-08-28 DIAGNOSIS — Z01419 Encounter for gynecological examination (general) (routine) without abnormal findings: Secondary | ICD-10-CM | POA: Insufficient documentation

## 2022-08-29 ENCOUNTER — Other Ambulatory Visit: Payer: Self-pay | Admitting: Certified Nurse Midwife

## 2022-08-29 ENCOUNTER — Encounter: Payer: Self-pay | Admitting: Certified Nurse Midwife

## 2022-08-29 DIAGNOSIS — R928 Other abnormal and inconclusive findings on diagnostic imaging of breast: Secondary | ICD-10-CM

## 2022-08-30 ENCOUNTER — Ambulatory Visit
Admission: RE | Admit: 2022-08-30 | Discharge: 2022-08-30 | Disposition: A | Discharge status: Home / Self Care | Payer: BC Managed Care – PPO | Source: Ambulatory Visit | Attending: Certified Nurse Midwife | Admitting: Certified Nurse Midwife

## 2022-08-30 DIAGNOSIS — R928 Other abnormal and inconclusive findings on diagnostic imaging of breast: Secondary | ICD-10-CM

## 2022-09-15 ENCOUNTER — Other Ambulatory Visit: Payer: BC Managed Care – PPO

## 2023-02-18 ENCOUNTER — Other Ambulatory Visit: Payer: Self-pay | Admitting: Certified Nurse Midwife

## 2023-02-22 ENCOUNTER — Telehealth: Payer: Self-pay

## 2023-02-22 NOTE — Telephone Encounter (Signed)
Pt calling for refill of nitrofurantoin 100mg  capsules; [facility] tells [facility] are no refills left; mychart says she has refill thru Nov of this year. [phone]

## 2023-02-23 ENCOUNTER — Other Ambulatory Visit: Payer: Self-pay

## 2023-02-23 DIAGNOSIS — N941 Unspecified dyspareunia: Secondary | ICD-10-CM

## 2023-02-23 NOTE — Telephone Encounter (Signed)
Patient contacted office requesting refill on macrobid, patient was last seen in office on 02/08/22 for annual physical. Patient was advised by phone that office visit would need to be scheduled prior to refills. Patient refused to schedule office visit and states that she had a hysterectomy and states that she was told by Pattricia Boss at her last physical that she would not need another physical for 5 years. Patient has declined appointment because of this and is wanting annie to fill Macrobid, patient advised that Pattricia Boss is not in office today and will review message upon returning. Patient verbalized understanding. KW

## 2023-02-23 NOTE — Telephone Encounter (Signed)
Pt is stating that she was told that she doesn't need to come back in for 5 years.  Says that she does not need an annual exam bc of having a hysterectomy.  Georgiann Hahn is sending a message to Doreene Burke concerning this visit.

## 2023-03-07 MED ORDER — NITROFURANTOIN MONOHYD MACRO 100 MG PO CAPS: 100.0000 mg | ORAL_CAPSULE | Freq: Once | ORAL | 3 refills | Status: DC | PRN

## 2023-03-07 NOTE — Telephone Encounter (Signed)
Patient has been advised. KW 

## 2023-05-29 ENCOUNTER — Other Ambulatory Visit: Payer: Self-pay | Admitting: Cardiovascular Disease

## 2023-05-30 ENCOUNTER — Ambulatory Visit: Payer: BC Managed Care – PPO

## 2023-06-11 ENCOUNTER — Encounter: Payer: Self-pay | Admitting: Emergency Medicine

## 2023-06-11 ENCOUNTER — Ambulatory Visit
Admission: EM | Admit: 2023-06-11 | Discharge: 2023-06-11 | Disposition: A | Discharge status: Home / Self Care | Payer: BC Managed Care – PPO | Attending: Emergency Medicine | Admitting: Emergency Medicine

## 2023-06-11 DIAGNOSIS — M545 Low back pain, unspecified: Secondary | ICD-10-CM | POA: Insufficient documentation

## 2023-06-11 DIAGNOSIS — N390 Urinary tract infection, site not specified: Secondary | ICD-10-CM | POA: Insufficient documentation

## 2023-06-11 LAB — URINALYSIS, W/ REFLEX TO CULTURE (INFECTION SUSPECTED)
Bilirubin Urine: NEGATIVE
Glucose, UA: NEGATIVE mg/dL
Hgb urine dipstick: NEGATIVE
Ketones, ur: NEGATIVE mg/dL
Nitrite: NEGATIVE
Protein, ur: NEGATIVE mg/dL
Specific Gravity, Urine: 1.01 (ref 1.005–1.030)
pH: 7 (ref 5.0–8.0)

## 2023-06-11 MED ORDER — TIZANIDINE HCL 4 MG PO TABS: 4.0000 mg | ORAL_TABLET | Freq: Three times a day (TID) | ORAL | 0 refills | Status: AC | PRN

## 2023-06-11 MED ORDER — PHENAZOPYRIDINE HCL 200 MG PO TABS: 200.0000 mg | ORAL_TABLET | Freq: Three times a day (TID) | ORAL | 0 refills | Status: AC | PRN

## 2023-06-11 MED ORDER — MELOXICAM 15 MG PO TABS: 15.0000 mg | ORAL_TABLET | Freq: Every day | ORAL | 0 refills | Status: AC

## 2023-06-11 MED ORDER — CEPHALEXIN 500 MG PO CAPS: 500.0000 mg | ORAL_CAPSULE | Freq: Four times a day (QID) | ORAL | 0 refills | Status: AC

## 2023-06-11 NOTE — ED Triage Notes (Signed)
Pt presents with lower back pain x 1 month. Pt was seeing a chiropractor and it wasn't helping. In the past few days she developed urinary frequency.

## 2023-06-11 NOTE — Discharge Instructions (Signed)
I going to treat you for complicated urinary tract infection with Keflex 4 times a day.  The Pyridium will help with the urgency and frequency.  Try the meloxicam with 1000 mg of Tylenol in the morning.  May take an additional 1000 mg Tylenol 2 or 3 times times per day.  Zanaflex will help with any muscle spasms.  Go to the ER for fevers above 100.4, pain not controlled with medications, abdominal pain, if you pass out, or for any concerns.  Please follow-up with your PCP in several days.

## 2023-06-11 NOTE — ED Provider Notes (Signed)
HPI  SUBJECTIVE:  Margaret Bryan is a 62 y.o. female who presents with constant bilateral low back pain described as soreness, worse on the left for the past month.  She states that she cannot get comfortable.  She reports urinary urgency, frequency, odorous urine for over a week.  No change in her baseline nausea, no vomiting, fevers, abdominal, pelvic pain.  She denies dysuria, cloudy urine, hematuria.  No recent heavy lifting, trauma to the back, change in physical activity, saddle anesthesia, urinary/ fecal incontinence, urinary retention, leg weakness.  She has tried Aleve, Tylenol, increasing fluids, seeing her chiropractor without improvement in her symptoms.  Symptoms are better with Norco, worse with lots of walking, torso rotation and bending forward.  She has had symptoms like this before where she was found to have a UTI.  She was on a prolonged course of prednisone, Levaquin and Augmentin for sinusitis/otitis media earlier this month, states that she finished these about 2 weeks ago.  She had the back pain while taking the prednisone and the antibiotics without any relief.  She has a past medical history of pancreatitis, irritable bowel syndrome, UTI and is status post hysterectomy.  No history of cancer, aortic abdominal aneurysm, pyelonephritis, nephrolithiasis.  PCP: [facility].   Past Medical History:  Diagnosis Date   Cancer ([facility])    skin ca   GERD (gastroesophageal reflux disease)    Heart murmur    Migraine    Pancreas divisum     Past Surgical History:  Procedure Laterality Date   ABDOMINAL HYSTERECTOMY     NASAL SINUS SURGERY      Family History  Problem Relation Age of Onset   Heart failure Mother    Hypertension Mother    Esophageal cancer Mother    Heart failure Father    Hypertension Father    Breast cancer Maternal Aunt 8   Breast cancer Maternal Grandmother 40   Breast cancer Cousin 20    Social History   Tobacco Use   Smoking status: Never    Smokeless tobacco: Never  Vaping Use   Vaping status: Never Used  Substance Use Topics   Alcohol use: No   Drug use: Never    No current facility-administered medications for this encounter.  Current Outpatient Medications:    cephALEXin (KEFLEX) 500 MG capsule, Take 1 capsule (500 mg total) by mouth 4 (four) times daily for 10 days., Disp: 40 capsule, Rfl: 0   meloxicam (MOBIC) 15 MG tablet, Take 1 tablet (15 mg total) by mouth daily., Disp: 10 tablet, Rfl: 0   phenazopyridine (PYRIDIUM) 200 MG tablet, Take 1 tablet (200 mg total) by mouth 3 (three) times daily as needed for pain., Disp: 6 tablet, Rfl: 0   tiZANidine (ZANAFLEX) 4 MG tablet, Take 1 tablet (4 mg total) by mouth every 8 (eight) hours as needed for muscle spasms., Disp: 30 tablet, Rfl: 0   atorvastatin (LIPITOR) 10 MG tablet, Take 10 mg by mouth daily., Disp: , Rfl:    eletriptan (RELPAX) 40 MG tablet, Take 40 mg by mouth as needed for migraine or headache. May repeat in 2 hours if headache persists or recurs., Disp: , Rfl:    esomeprazole (NEXIUM) 40 MG capsule, Take 1 capsule (40 mg total) by mouth daily., Disp: 30 capsule, Rfl: 0   fexofenadine (ALLEGRA) 180 MG tablet, Take 180 mg by mouth daily., Disp: , Rfl:    fluticasone (FLONASE) 50 MCG/ACT nasal spray, Place 2 sprays into both nostrils  daily as needed for allergies or rhinitis. , Disp: , Rfl:    Magnesium Oxide 250 MG TABS, Take 250 mg by mouth daily. , Disp: , Rfl:    meclizine (ANTIVERT) 25 MG tablet, Take 1 tablet (25 mg total) by mouth 3 (three) times daily as needed for dizziness., Disp: 30 tablet, Rfl: 0   montelukast (SINGULAIR) 10 MG tablet, Take by mouth., Disp: , Rfl:    Multiple Vitamins-Minerals (ONE-A-DAY 50 PLUS PO), Take 1 tablet by mouth daily. , Disp: , Rfl:    nitroGLYCERIN (NITROSTAT) 0.4 MG SL tablet, Place 1 tablet (0.4 mg total) under the tongue every 5 (five) minutes as needed for chest pain., Disp: 30 tablet, Rfl: 0   ondansetron (ZOFRAN-ODT) 8  MG disintegrating tablet, Take 8 mg by mouth every 8 (eight) hours as needed for nausea or vomiting., Disp: , Rfl:    PAPAYA PO, Take by mouth. Prn for pancreas, Disp: , Rfl:    ranolazine (RANEXA) 500 MG 12 hr tablet, Take 1 tablet (500 mg total) by mouth 2 (two) times daily., Disp: 180 tablet, Rfl: 3   spironolactone (ALDACTONE) 25 MG tablet, Take 0.5 tablets (12.5 mg total) by mouth daily. PLEASE SCHEDULE FOLLOW UP APPOINTMENT FOR ADDITIONAL REFILLS [phone], Disp: 15 tablet, Rfl: 0   thiamine 100 MG tablet, Take by mouth., Disp: , Rfl:    topiramate (TOPAMAX) 100 MG tablet, Take 100 mg by mouth 2 (two) times daily., Disp: , Rfl:    zinc sulfate 220 (50 Zn) MG capsule, Take by mouth., Disp: , Rfl:   Allergies  Allergen Reactions   Iodine Other (See Comments)    Strange feeling in mouth, teary eyes, increased blood pressure. Strange feeling in mouth, teary eyes, increased blood pressure. Strange feeling in mouth, teary eyes, increased blood pressure. Strange feeling in mouth, teary eyes, increased blood pressure. Strange feeling in mouth, teary eyes, increased blood pressure.   Hctz [Hydrochlorothiazide] Other (See Comments)    Nausea, blistering (feet, hands), tingling of the feet.   Potassium Other (See Comments)    Nausea, blistering (feet, hands), tingling of the feet.   Potassium-Containing Compounds Other (See Comments)    Nausea, blistering (feet, hands), tingling of the feet.   Gadobenate Other (See Comments)    Eye irritaion/lip tingling   Imitrex [Sumatriptan]    Percocet [Oxycodone-Acetaminophen] Other (See Comments)    Hallucinations, rapid heartbeat     ROS  As noted in HPI.   Physical Exam  BP (!) 140/75 (BP Location: Left Arm)   Pulse 76   Resp 16   SpO2 99%   Constitutional: Well developed, well nourished, no acute distress Eyes:  EOMI, conjunctiva normal bilaterally HENT: Normocephalic, atraumatic,mucus membranes moist Respiratory: Normal inspiratory  effort Cardiovascular: Normal rate GI: nondistended. No suprapubic, flank tenderness.  No palpable pulsatile abdominal mass.  No abdominal bruit. skin: No rash, skin intact Musculoskeletal: no CVAT.  Left paralumbar, QL tenderness, positive muscle spasm. No L-spine, SI joint, sacral bony tenderness. Bilateral lower extremities nontender, baseline ROM with intact DP  pulses.  Sensation between thighs intact.  Pain with active left hip flexion against resistance.  No pain with passive int/ext rotation, extension hips, AD/ABduction bilaterally. SLR neg bilaterally. Sensation baseline light touch bilaterally for Pt, DTR's symmetric and intact bilaterally KJ, Motor symmetric bilateral 5/5 hip flexion, quadriceps, hamstrings, EHL, foot dorsiflexion, foot plantarflexion, gait normal. Neurologic: Alert & oriented x 3, no focal neuro deficits Psychiatric: Speech and behavior appropriate   ED Course  Medications - No data to display  Orders Placed This Encounter  Procedures   Urine Culture    Standing Status:   Standing    Number of Occurrences:   1    Order Specific Question:   Indication    Answer:   Dysuria   Urinalysis, w/ Reflex to Culture (Infection Suspected) -Urine, Clean Catch    Standing Status:   Standing    Number of Occurrences:   1    Order Specific Question:   Specimen Source    Answer:   Urine, Clean Catch [76]    Results for orders placed or performed during the hospital encounter of 06/11/23 (from the past 24 hour(s))  Urinalysis, w/ Reflex to Culture (Infection Suspected) -Urine, Clean Catch     Status: Abnormal   Collection Time: 06/11/23  6:57 PM  Result Value Ref Range   Specimen Source URINE, CLEAN CATCH    Color, Urine YELLOW YELLOW   APPearance CLEAR CLEAR   Specific Gravity, Urine 1.010 1.005 - 1.030   pH 7.0 5.0 - 8.0   Glucose, UA NEGATIVE NEGATIVE mg/dL   Hgb urine dipstick NEGATIVE NEGATIVE   Bilirubin Urine NEGATIVE NEGATIVE   Ketones, ur NEGATIVE  NEGATIVE mg/dL   Protein, ur NEGATIVE NEGATIVE mg/dL   Nitrite NEGATIVE NEGATIVE   Leukocytes,Ua TRACE (A) NEGATIVE   Squamous Epithelial / HPF 0-5 0 - 5 /HPF   WBC, UA 6-10 0 - 5 WBC/hpf   RBC / HPF 0-5 0 - 5 RBC/hpf   Bacteria, UA FEW (A) NONE SEEN   No results found.  ED Clinical Impression  1. Acute left-sided low back pain without sciatica   2. Urinary tract infection without hematuria, site unspecified     ED Assessment/Plan     She has trace leukocytes and a few bacteria.  This is a clean sample.  No hematuria.  In the differential is musculoskeletal back pain, nephrolithiasis, complicated UTI.  Considered but doubt epidural abscess, aortic abdominal aneurysm, cauda equina syndrome, metastasis/pathologic vertebral fracture based on history and exam.   No evidence of spinal cord involvement based on H&P.  It has been < 6 week duration. No historical red flags as noted in HPI. No physical red flags such as fever, bony tenderness, lower extremity weakness, saddle anesthesia. Imaging not indicated at this time.   Her presentation is most consistent with musculoskeletal pain as she has reproducible tenderness over the QL and left paralumbar region, and the pain is aggravated with left hip flexion against resistance.  I suspect that she has had a UTI with the urinary symptoms for the past week.  She has been on Levaquin which she discontinued after 5 days and Augmentin, which could have partially treated a urinary tract infection.  Sending home with Mobic 15 mg combined with Tylenol once a day.  May take an additional 1000 mg of Tylenol 2-3 more times a day.  Zanaflex.  Keflex for complicated UTI, Pyridium.  Continue pushing fluids.  Advised close follow-up with her PCP.  Strict ER return precautions given.  Discussed labs,  MDM, treatment plan, and plan for follow-up with patient. Discussed sn/sx that should prompt return to the ED. patient agrees with plan.   Meds ordered this  encounter  Medications   cephALEXin (KEFLEX) 500 MG capsule    Sig: Take 1 capsule (500 mg total) by mouth 4 (four) times daily for 10 days.    Dispense:  40 capsule    Refill:  0  phenazopyridine (PYRIDIUM) 200 MG tablet    Sig: Take 1 tablet (200 mg total) by mouth 3 (three) times daily as needed for pain.    Dispense:  6 tablet    Refill:  0   tiZANidine (ZANAFLEX) 4 MG tablet    Sig: Take 1 tablet (4 mg total) by mouth every 8 (eight) hours as needed for muscle spasms.    Dispense:  30 tablet    Refill:  0   meloxicam (MOBIC) 15 MG tablet    Sig: Take 1 tablet (15 mg total) by mouth daily.    Dispense:  10 tablet    Refill:  0    *This clinic note was created using Scientist, clinical (histocompatibility and immunogenetics). Therefore, there may be occasional mistakes despite careful proofreading.  ?     Domenick Gong, MD 06/12/23 1702

## 2023-06-13 LAB — URINE CULTURE: Culture: NO GROWTH

## 2023-06-20 ENCOUNTER — Other Ambulatory Visit: Payer: Self-pay | Admitting: Cardiovascular Disease

## 2023-06-20 NOTE — Telephone Encounter (Signed)
Good Morning,   Could you please schedule this patient a 12 month follow up visit? This patient was last seen by Dr. Mariah Milling on 06-13-22. Thank you so much.

## 2023-06-21 NOTE — Telephone Encounter (Signed)
Pt is being seen on 10/18

## 2023-06-27 ENCOUNTER — Other Ambulatory Visit: Payer: Self-pay | Admitting: Cardiovascular Disease

## 2023-06-28 NOTE — Progress Notes (Deleted)
Date:  06/28/2023   ID:  [name], DOB May 29, 1961, MRN [id number]  Patient Location:  [address] [place] [address]   Provider location:   [facility],  office  PCP:  Marina Goodell, MD  Cardiologist:  [facility]  No chief complaint on file.   History of Present Illness:    Margaret Bryan is a 62 y.o. female  past medical history of  chronic pancreatitis, hyperlipidemia  presenting to the hospital January 08, 2020 with chest pain concerning for angina Cardiac CTA May 2021 no significant coronary disease, calcium score 0 Who presents for follow-up of her chest pain  Last seen by myself in clinic October 2023   On prior clinic visits in 2022 reported having lower extremity edema Transitioned off amlodipine Had side effects on HCTZ Was given trial of Lasix with potassium Transition to spironolactone which has worked well for her  Reports no significant leg swelling, Has chronic bruising  Chest discomfort better on Ranexa   non-smoker, no diabetes Blood pressure stable Tolerating Lipitor 10, cholesterol 250 down to 190  EKG personally reviewed by myself on todays visit Normal sinus rhythm with rate 68 bpm no significant ST-T wave changes   Past medical history reviewed Previous episodes of significant left-sided chest pain radiating to her left jaw left arm Started overnight, woke up in the morning with recurrent symptoms, presented to the fire department EKG nonacute in the hospital Cardiac enzymes negative Markedly hypertensive at EMTs/fire department, high blood pressure persisted for several hours   Cardiac CTA Jan 22, 2020 1. Coronary calcium score of 0. Patient is low risk for near term coronary events 2. Normal coronary origin with right dominance. 3. No evidence of CAD. 4.CAD-RADS 0. Consider non-atherosclerotic causes of chest pain   Prior CV studies:   The following studies were reviewed  today:    Past Medical History:  Diagnosis Date   Cancer (HCC)    skin ca   GERD (gastroesophageal reflux disease)    Heart murmur    Migraine    Pancreas divisum    Past Surgical History:  Procedure Laterality Date   ABDOMINAL HYSTERECTOMY     NASAL SINUS SURGERY        Allergies:   Iodine, Hctz [hydrochlorothiazide], Potassium, Potassium-containing compounds, Gadobenate, Imitrex [sumatriptan], and Percocet [oxycodone-acetaminophen]   Social History   Tobacco Use   Smoking status: Never   Smokeless tobacco: Never  Vaping Use   Vaping status: Never Used  Substance Use Topics   Alcohol use: No   Drug use: Never     Current Outpatient Medications on File Prior to Visit  Medication Sig Dispense Refill   atorvastatin (LIPITOR) 10 MG tablet Take 10 mg by mouth daily.     eletriptan (RELPAX) 40 MG tablet Take 40 mg by mouth as needed for migraine or headache. May repeat in 2 hours if headache persists or recurs.     esomeprazole (NEXIUM) 40 MG capsule Take 1 capsule (40 mg total) by mouth daily. 30 capsule 0   fexofenadine (ALLEGRA) 180 MG tablet Take 180 mg by mouth daily.     fluticasone (FLONASE) 50 MCG/ACT nasal spray Place 2 sprays into both nostrils daily as needed for allergies or rhinitis.      Magnesium Oxide 250 MG TABS Take 250 mg by mouth daily.      meclizine (ANTIVERT) 25 MG tablet Take 1 tablet (25 mg total) by mouth 3 (  three) times daily as needed for dizziness. 30 tablet 0   meloxicam (MOBIC) 15 MG tablet Take 1 tablet (15 mg total) by mouth daily. 10 tablet 0   montelukast (SINGULAIR) 10 MG tablet Take by mouth.     Multiple Vitamins-Minerals (ONE-A-DAY 50 PLUS PO) Take 1 tablet by mouth daily.      nitroGLYCERIN (NITROSTAT) 0.4 MG SL tablet Place 1 tablet (0.4 mg total) under the tongue every 5 (five) minutes as needed for chest pain. 30 tablet 0   ondansetron (ZOFRAN-ODT) 8 MG disintegrating tablet Take 8 mg by mouth every 8 (eight) hours as needed for  nausea or vomiting.     PAPAYA PO Take by mouth. Prn for pancreas     phenazopyridine (PYRIDIUM) 200 MG tablet Take 1 tablet (200 mg total) by mouth 3 (three) times daily as needed for pain. 6 tablet 0   ranolazine (RANEXA) 500 MG 12 hr tablet TAKE 1 TABLET BY MOUTH TWICE A DAY 180 tablet 0   spironolactone (ALDACTONE) 25 MG tablet Take 0.5 tablets (12.5 mg total) by mouth daily. 45 tablet 0   thiamine 100 MG tablet Take by mouth.     tiZANidine (ZANAFLEX) 4 MG tablet Take 1 tablet (4 mg total) by mouth every 8 (eight) hours as needed for muscle spasms. 30 tablet 0   topiramate (TOPAMAX) 100 MG tablet Take 100 mg by mouth 2 (two) times daily.     zinc sulfate 220 (50 Zn) MG capsule Take by mouth.     [DISCONTINUED] amLODipine (NORVASC) 2.5 MG tablet Take 1 tablet (2.5 mg total) by mouth daily. 90 tablet 3   No current facility-administered medications on file prior to visit.     Family Hx: The patient's family history includes Breast cancer (age of onset: 50) in her cousin; Breast cancer (age of onset: 60) in her maternal aunt; Breast cancer (age of onset: 74) in her maternal grandmother; Esophageal cancer in her mother; Heart failure in her father and mother; Hypertension in her father and mother.  ROS:   Please see the history of present illness.    Review of Systems  Constitutional: Negative.   HENT: Negative.    Respiratory: Negative.    Cardiovascular:  Positive for chest pain.  Gastrointestinal: Negative.   Musculoskeletal: Negative.   Neurological: Negative.   Psychiatric/Behavioral: Negative.    All other systems reviewed and are negative.     Labs/Other Tests and Data Reviewed:    Recent Labs: No results found for requested labs within last 365 days.   Recent Lipid Panel No results found for: "CHOL", "TRIG", "HDL", "CHOLHDL", "LDLCALC", "LDLDIRECT"  Wt Readings from Last 3 Encounters:  06/13/22 180 lb 6 oz (81.8 kg)  02/08/22 180 lb 3.2 oz (81.7 kg)  11/21/21 182  lb 15.7 oz (83 kg)     Exam:    Vital Signs: Vital signs may also be detailed in the HPI There were no vitals taken for this visit.  Constitutional:  oriented to person, place, and time. No distress.  HENT:  Head: Grossly normal Eyes:  no discharge. No scleral icterus.  Neck: No JVD, no carotid bruits  Cardiovascular: Regular rate and rhythm, no murmurs appreciated Pulmonary/Chest: Clear to auscultation bilaterally, no wheezes or rails Abdominal: Soft.  no distension.  no tenderness.  Musculoskeletal: Normal range of motion Neurological:  normal muscle tone. Coordination normal. No atrophy Skin: Skin warm and dry Psychiatric: normal affect, pleasant   ASSESSMENT & PLAN:    Problem  List Items Addressed This Visit   None  Atypical chest discomfort Unable to exclude coronary spasm Cardiac CTA discussed, no significant coronary disease Doing well on Ranexa, no further work-up needed  Hyperlipidemia Improved numbers on Lipitor 10  Leg swelling Improved with holding amlodipine, starting spironolactone 12.5 mg daily Potassium within normal range  Chronic pancreatitis Followed by GI, prior intervention at [facility] Has learned how to manage this   Total encounter time more than 30 minutes  Greater than 50% was spent in counseling and coordination of care with the patient   Signed, Julien Nordmann, MD  [facility] [address], [place], [place] [address]

## 2023-06-29 ENCOUNTER — Ambulatory Visit: Payer: 59 | Admitting: Cardiovascular Disease

## 2023-06-29 DIAGNOSIS — R079 Chest pain, unspecified: Secondary | ICD-10-CM

## 2023-06-29 DIAGNOSIS — E785 Hyperlipidemia, unspecified: Secondary | ICD-10-CM

## 2023-06-29 DIAGNOSIS — M7989 Other specified soft tissue disorders: Secondary | ICD-10-CM

## 2023-06-29 DIAGNOSIS — I10 Essential (primary) hypertension: Secondary | ICD-10-CM

## 2023-06-29 DIAGNOSIS — R0609 Other forms of dyspnea: Secondary | ICD-10-CM

## 2023-07-02 NOTE — Progress Notes (Unsigned)
Date:  07/03/2023   ID:  Margaret Bryan, DOB October 29, 1960, MRN [id number]  Patient Location:  [address] [place] [address]   Provider location:   [facility] [place] office  PCP:  Marina Goodell, MD  Cardiologist:  Fonnie Mu  Chief Complaint  Patient presents with   12 month follow up     "Doing well." Medications reviewed by the patient verbally.     History of Present Illness:    Margaret Bryan is a 62 y.o. 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 Denies significant chest pain on Ranexa Off amlodipine, leg swelling better Blood pressure stable, low on spironolactone 12.5 daily  Prior side effects on HCTZ Was given trial of Lasix with potassium  Non-smoker, no diabetes  Tolerating Lipitor 10, cholesterol 250 down to 180  EKG personally reviewed by myself on todays visit EKG Interpretation Date/Time:  Tuesday July 03 2023 11:26:56 EDT Ventricular Rate:  72 PR Interval:  156 QRS Duration:  76 QT Interval:  380 QTC Calculation: 416 R Axis:   -11  Text Interpretation: Normal sinus rhythm Normal ECG When compared with ECG of 21-Nov-2021 12:55, No significant change was found Confirmed by Julien Nordmann [phone]) on 07/03/2023 11:29:00 AM   Past medical history reviewed Previous episodes of significant left-sided chest pain radiating to her left jaw left arm Started overnight, woke up in the morning with recurrent symptoms, presented to the fire department EKG nonacute in the hospital Cardiac enzymes negative Markedly hypertensive at EMTs/fire  department, high blood pressure persisted for several hours   Cardiac CTA Jan 22, 2020 1. Coronary calcium score of 0. Patient is low risk for near term coronary events 2. Normal coronary origin with right dominance. 3. No evidence of CAD. 4.CAD-RADS 0. Consider non-atherosclerotic causes of chest pain    Past Medical History:  Diagnosis Date   Cancer (HCC)    skin ca   GERD (gastroesophageal reflux disease)    Heart murmur    Migraine    Pancreas divisum    Past Surgical History:  Procedure Laterality Date   ABDOMINAL HYSTERECTOMY     NASAL SINUS SURGERY        Allergies:   Iodine, Hctz [hydrochlorothiazide], Potassium, Potassium-containing compounds, Gadobenate, Imitrex [sumatriptan], and Percocet [oxycodone-acetaminophen]   Social History   Tobacco Use   Smoking status: Never   Smokeless tobacco: Never  Vaping Use   Vaping status: Never Used  Substance Use Topics   Alcohol use: No   Drug use: Never     Current Outpatient Medications on File Prior to Visit  Medication Sig Dispense Refill   atorvastatin (LIPITOR) 10 MG tablet Take 10 mg by mouth daily.     azithromycin (ZITHROMAX) 250 MG tablet Take 250 mg by mouth 3 (three) times a week.     eletriptan (RELPAX) 40 MG tablet Take 40 mg by mouth as needed for migraine or headache. May repeat in 2 hours if headache persists or recurs.     esomeprazole (NEXIUM) 40 MG capsule Take 1 capsule (40 mg total) by mouth daily. 30 capsule 0  fexofenadine (ALLEGRA) 180 MG tablet Take 180 mg by mouth daily.     fluticasone (FLONASE) 50 MCG/ACT nasal spray Place 2 sprays into both nostrils daily as needed for allergies or rhinitis.      Magnesium Oxide 250 MG TABS Take 250 mg by mouth daily.      meclizine (ANTIVERT) 25 MG tablet Take 1 tablet (25 mg total) by mouth 3 (three) times daily as needed for dizziness. 30 tablet 0   montelukast (SINGULAIR) 10 MG tablet Take by mouth.     Multiple Vitamins-Minerals (ONE-A-DAY 50 PLUS  PO) Take 1 tablet by mouth daily.      nitroGLYCERIN (NITROSTAT) 0.4 MG SL tablet Place 1 tablet (0.4 mg total) under the tongue every 5 (five) minutes as needed for chest pain. 30 tablet 0   ondansetron (ZOFRAN-ODT) 8 MG disintegrating tablet Take 8 mg by mouth every 8 (eight) hours as needed for nausea or vomiting.     PAPAYA PO Take by mouth. Prn for pancreas     phenazopyridine (PYRIDIUM) 200 MG tablet Take 1 tablet (200 mg total) by mouth 3 (three) times daily as needed for pain. 6 tablet 0   ranolazine (RANEXA) 500 MG 12 hr tablet TAKE 1 TABLET BY MOUTH TWICE A DAY 180 tablet 0   spironolactone (ALDACTONE) 25 MG tablet Take 0.5 tablets (12.5 mg total) by mouth daily. 45 tablet 0   thiamine 100 MG tablet Take by mouth.     tiZANidine (ZANAFLEX) 4 MG tablet Take 1 tablet (4 mg total) by mouth every 8 (eight) hours as needed for muscle spasms. 30 tablet 0   topiramate (TOPAMAX) 100 MG tablet Take 100 mg by mouth 2 (two) times daily.     zinc sulfate 220 (50 Zn) MG capsule Take by mouth.     meloxicam (MOBIC) 15 MG tablet Take 1 tablet (15 mg total) by mouth daily. (Patient not taking: Reported on 07/03/2023) 10 tablet 0   No current facility-administered medications on file prior to visit.     Family Hx: The patient's family history includes Breast cancer (age of onset: 29) in her cousin; Breast cancer (age of onset: 56) in her maternal aunt; Breast cancer (age of onset: 28) in her maternal grandmother; Esophageal cancer in her mother; Heart failure in her father and mother; Hypertension in her father and mother.  ROS:   Please see the history of present illness.    Review of Systems  Constitutional: Negative.   HENT: Negative.    Respiratory: Negative.    Cardiovascular:  Positive for chest pain.  Gastrointestinal: Negative.   Musculoskeletal: Negative.   Neurological: Negative.   Psychiatric/Behavioral: Negative.    All other systems reviewed and are negative.     Labs/Other  Tests and Data Reviewed:    Recent Labs: No results found for requested labs within last 365 days.   Recent Lipid Panel No results found for: "CHOL", "TRIG", "HDL", "CHOLHDL", "LDLCALC", "LDLDIRECT"  Wt Readings from Last 3 Encounters:  07/03/23 180 lb 2 oz (81.7 kg)  06/13/22 180 lb 6 oz (81.8 kg)  02/08/22 180 lb 3.2 oz (81.7 kg)     Exam:    Vital Signs: Vital signs may also be detailed in the HPI BP 102/60 (BP Location: Left Arm, Patient Position: Sitting, Cuff Size: Normal)   Pulse 72   Ht 5\' 8"  (1.727 m)   Wt 180 lb 2 oz (81.7 kg)   SpO2 97%   BMI 27.39 kg/m  Constitutional:  oriented to person, place, and time. No distress.  HENT:  Head: Grossly normal Eyes:  no discharge. No scleral icterus.  Neck: No JVD, no carotid bruits  Cardiovascular: Regular rate and rhythm, no murmurs appreciated Pulmonary/Chest: Clear to auscultation bilaterally, no wheezes or rails Abdominal: Soft.  no distension.  no tenderness.  Musculoskeletal: Normal range of motion Neurological:  normal muscle tone. Coordination normal. No atrophy Skin: Skin warm and dry Psychiatric: normal affect, pleasant   ASSESSMENT & PLAN:    Problem List Items Addressed This Visit     Chest pain - Primary   Relevant Orders   EKG 12-Lead (Completed)   Other Visit Diagnoses     DOE (dyspnea on exertion)       Relevant Orders   EKG 12-Lead (Completed)   Leg swelling       Essential hypertension       Relevant Orders   EKG 12-Lead (Completed)   Hyperlipidemia, unspecified hyperlipidemia type          Atypical chest discomfort Unable to exclude coronary spasm Cardiac CTA with no significant coronary disease, calcium score 0 Doing well on Ranexa 500 twice daily, reports symptoms well-controlled  Hyperlipidemia Improved numbers on Lipitor 10 Total cholesterol 180  Leg swelling Resolved off calcium channel blocker  Tolerating spironolactone 12.5 mg daily Potassium within normal  range  Chronic pancreatitis Followed by GI, prior intervention at [employer] [employer], Julien Nordmann, MD  [facility] [address], [place], [place] [address]

## 2023-07-03 ENCOUNTER — Ambulatory Visit: Payer: BC Managed Care – PPO | Attending: Cardiovascular Disease | Admitting: Cardiovascular Disease

## 2023-07-03 ENCOUNTER — Encounter: Payer: Self-pay | Admitting: Cardiovascular Disease

## 2023-07-03 VITALS — BP 102/60 | HR 72 | Ht 68.0 in | Wt 180.1 lb

## 2023-07-03 DIAGNOSIS — R079 Chest pain, unspecified: Secondary | ICD-10-CM

## 2023-07-03 DIAGNOSIS — M7989 Other specified soft tissue disorders: Secondary | ICD-10-CM

## 2023-07-03 DIAGNOSIS — R0609 Other forms of dyspnea: Secondary | ICD-10-CM

## 2023-07-03 DIAGNOSIS — I10 Essential (primary) hypertension: Secondary | ICD-10-CM

## 2023-07-03 DIAGNOSIS — E785 Hyperlipidemia, unspecified: Secondary | ICD-10-CM

## 2023-07-03 MED ORDER — RANOLAZINE ER 500 MG PO TB12: 500.0000 mg | ORAL_TABLET | Freq: Two times a day (BID) | ORAL | 3 refills | Status: AC

## 2023-07-03 MED ORDER — SPIRONOLACTONE 25 MG PO TABS: 12.5000 mg | ORAL_TABLET | Freq: Every day | ORAL | 3 refills | Status: DC

## 2023-07-03 NOTE — Patient Instructions (Signed)

## 2023-07-18 ENCOUNTER — Other Ambulatory Visit: Payer: Self-pay | Admitting: Certified Nurse Midwife

## 2023-07-18 DIAGNOSIS — Z1231 Encounter for screening mammogram for malignant neoplasm of breast: Secondary | ICD-10-CM

## 2023-08-30 ENCOUNTER — Ambulatory Visit
Admission: RE | Admit: 2023-08-30 | Discharge: 2023-08-30 | Disposition: A | Discharge status: Home / Self Care | Payer: BC Managed Care – PPO | Source: Ambulatory Visit | Attending: Family Medicine | Admitting: Family Medicine

## 2023-08-30 DIAGNOSIS — Z1231 Encounter for screening mammogram for malignant neoplasm of breast: Secondary | ICD-10-CM | POA: Diagnosis present

## 2023-11-09 ENCOUNTER — Other Ambulatory Visit: Payer: Self-pay

## 2023-11-09 ENCOUNTER — Emergency Department: Payer: 59

## 2023-11-09 DIAGNOSIS — J40 Bronchitis, not specified as acute or chronic: Secondary | ICD-10-CM | POA: Insufficient documentation

## 2023-11-09 DIAGNOSIS — R079 Chest pain, unspecified: Secondary | ICD-10-CM | POA: Diagnosis present

## 2023-11-09 DIAGNOSIS — Z85828 Personal history of other malignant neoplasm of skin: Secondary | ICD-10-CM | POA: Diagnosis not present

## 2023-11-09 LAB — BASIC METABOLIC PANEL
Anion gap: 11 (ref 5–15)
BUN: 17 mg/dL (ref 8–23)
CO2: 20 mmol/L — ABNORMAL LOW (ref 22–32)
Calcium: 8.7 mg/dL — ABNORMAL LOW (ref 8.9–10.3)
Chloride: 108 mmol/L (ref 98–111)
Creatinine, Ser: 0.75 mg/dL (ref 0.44–1.00)
GFR, Estimated: >60 mL/min (ref >60)
Glucose, Bld: 103 mg/dL — ABNORMAL HIGH (ref 70–99)
Potassium: 3.6 mmol/L (ref 3.5–5.1)
Sodium: 139 mmol/L (ref 135–145)

## 2023-11-09 LAB — CBC
HCT: 41.2 % (ref 36.0–46.0)
Hemoglobin: 13.2 g/dL (ref 12.0–15.0)
MCH: 31.1 pg (ref 26.0–34.0)
MCHC: 32 g/dL (ref 30.0–36.0)
MCV: 97.2 fL (ref 80.0–100.0)
Platelets: 229 10*3/uL (ref 150–400)
RBC: 4.24 MIL/uL (ref 3.87–5.11)
RDW: 13.2 % (ref 11.5–15.5)
WBC: 6 10*3/uL (ref 4.0–10.5)
nRBC: 0 % (ref 0.0–0.2)

## 2023-11-09 LAB — TROPONIN I (HIGH SENSITIVITY): Troponin I (High Sensitivity): <2 ng/L (ref <18)

## 2023-11-09 NOTE — ED Triage Notes (Signed)
 Pt reports the past 2 days she feels like she is gasping for air. Pt reports she started amitriptyline for IBS started to take Sunday, stopped it 2 days ago. Pt reports she was walking today and reports left arm discomfort up to shoulder, and continues to have shortness of breath. Pt reports dizziness and nausea. Pt talks in complete sentences no respiratory distress noted

## 2023-11-10 ENCOUNTER — Encounter: Payer: Self-pay | Admitting: Emergency Medicine

## 2023-11-10 ENCOUNTER — Emergency Department
Admission: EM | Admit: 2023-11-10 | Discharge: 2023-11-10 | Disposition: A | Discharge status: Home / Self Care | Payer: 59 | Attending: Emergency Medicine | Admitting: Emergency Medicine

## 2023-11-10 ENCOUNTER — Emergency Department

## 2023-11-10 DIAGNOSIS — R079 Chest pain, unspecified: Secondary | ICD-10-CM

## 2023-11-10 DIAGNOSIS — J40 Bronchitis, not specified as acute or chronic: Secondary | ICD-10-CM | POA: Diagnosis not present

## 2023-11-10 DIAGNOSIS — R0602 Shortness of breath: Secondary | ICD-10-CM

## 2023-11-10 LAB — TROPONIN I (HIGH SENSITIVITY): Troponin I (High Sensitivity): 2 ng/L (ref <18)

## 2023-11-10 LAB — HEPATIC FUNCTION PANEL
ALT: 17 U/L (ref 0–44)
AST: 19 U/L (ref 15–41)
Albumin: 3.8 g/dL (ref 3.5–5.0)
Alkaline Phosphatase: 67 U/L (ref 38–126)
Bilirubin, Direct: <0.1 mg/dL (ref 0.0–0.2)
Total Bilirubin: 0.5 mg/dL (ref 0.0–1.2)
Total Protein: 6.5 g/dL (ref 6.5–8.1)

## 2023-11-10 LAB — RESP PANEL BY RT-PCR (RSV, FLU A&B, COVID)  RVPGX2
Influenza A by PCR: NEGATIVE
Influenza B by PCR: NEGATIVE
Resp Syncytial Virus by PCR: NEGATIVE
SARS Coronavirus 2 by RT PCR: NEGATIVE

## 2023-11-10 LAB — LIPASE, BLOOD: Lipase: 81 U/L — ABNORMAL HIGH (ref 11–51)

## 2023-11-10 LAB — D-DIMER, QUANTITATIVE: D-Dimer, Quant: 0.73 ug{FEU}/mL — ABNORMAL HIGH (ref 0.00–0.50)

## 2023-11-10 MED ORDER — IOHEXOL 350 MG/ML SOLN
100.0000 mL | Freq: Once | INTRAVENOUS | Status: AC | PRN
  Administered 2023-11-10: 100 mL via INTRAVENOUS

## 2023-11-10 NOTE — ED Provider Notes (Addendum)
[facility] Provider Note    Event Date/Time   First MD Initiated Contact with Patient 11/10/23 0129     (approximate)   History   Chest Pain   HPI  Margaret Bryan is a 63 y.o. female   Past medical history of IBS and skin cancer not on active treatment, here with shortness of breath for 2 days.  She recently started amitriptyline and after this she developed some shortness of breath.  She stopped the medication thinking that is adverse effect approximately 2 days ago but still feels short of breath and today while walking she developed left-sided chest pain with radiation down the left arm.  This is subsided.  She came to the emergency department in the setting of 2 days of shortness of breath and then this episode of chest pain.  Over the past 3 days she has had a mild cough no fever no congestion no sore throat.  She is a non-smoker, has no other significant medical history.   External Medical Documents Reviewed: Cardiologist note from October 2024 seen for chest pain similar to description today "left-sided chest pain rating to the left jaw and left arm" and noted to have negative cardiac enzymes and hospital evaluation and a cardiac CTA obtained from Jan 22, 2020 was reviewed during that visit was normal.      Physical Exam   Triage Vital Signs: ED Triage Vitals  Encounter Vitals Group     BP 11/09/23 2039 (!) 150/81     Systolic BP Percentile --      Diastolic BP Percentile --      Pulse Rate 11/09/23 2039 87     Resp 11/09/23 2039 16     Temp 11/09/23 2039 98.1 F (36.7 C)     Temp Source 11/09/23 2039 Oral     SpO2 11/09/23 2039 100 %     Weight 11/09/23 2044 179 lb (81.2 kg)     Height 11/09/23 2044 5\' 8"  (1.727 m)     Head Circumference --      Peak Flow --      Pain Score 11/09/23 2044 4     Pain Loc --      Pain Education --      Exclude from Growth Chart --     Most recent vital signs: Vitals:   11/09/23 2039 11/10/23 0050   BP: (!) 150/81 (!) 149/69  Pulse: 87 69  Resp: 16 17  Temp: 98.1 F (36.7 C) 98.1 F (36.7 C)  SpO2: 100% 100%    General: Awake, no distress.  CV:  Good peripheral perfusion.  Resp:  Normal effort.  Abd:  No distention.  Other:  Breathing comfortably on room air no hypoxemia.  Slightly hypertensive otherwise vital signs normal.  Lung exam reveals no rales crackles focality wheezing.  She is soft benign abdominal exam.  Skin appears warm well-perfused.  She has mild bilateral ankle swelling with no tenderness.   ED Results / Procedures / Treatments   Labs (all labs ordered are listed, but only abnormal results are displayed) Labs Reviewed  BASIC METABOLIC PANEL - Abnormal; Notable for the following components:      Result Value   CO2 20 (*)    Glucose, Bld 103 (*)    Calcium 8.7 (*)    All other components within normal limits  RESP PANEL BY RT-PCR (RSV, FLU A&B, COVID)  RVPGX2  CBC  D-DIMER, QUANTITATIVE  LIPASE, BLOOD  HEPATIC FUNCTION  PANEL  TROPONIN I (HIGH SENSITIVITY)  TROPONIN I (HIGH SENSITIVITY)     I ordered and reviewed the above labs they are notable for negative troponin x 2  EKG  ED ECG REPORT I, Pilar Jarvis, the attending physician, personally viewed and interpreted this ECG.   Date: 11/10/2023  EKG Time: 2052  Rate: 78  Rhythm: sinus  Axis: nl  Intervals:none  ST&T Change: no stemi    RADIOLOGY I independently reviewed and interpreted chest x-ray and I see no obvious focality pneumothorax I also reviewed radiologist's formal read.   PROCEDURES:  Critical Care performed: No  Procedures   MEDICATIONS ORDERED IN ED: Medications - No data to display  IMPRESSION / MDM / ASSESSMENT AND PLAN / ED COURSE  I reviewed the triage vital signs and the nursing notes.                                Patient's presentation is most consistent with acute presentation with potential threat to life or bodily function.  Differential diagnosis  includes, but is not limited to, ACS, PE, dissection, pneumonia, respiratory infection, adverse effect of amitriptyline   The patient is on the cardiac monitor to evaluate for evidence of arrhythmia and/or significant heart rate changes.  MDM:    This patient with a mild cough and shortness of breath for the last 2 days with 1 episode of exertional chest pain and history of the same evaluated cardiology with negative cardiac evaluation in the last couple of years.  I considered ACS but I think less likely given her history of the same with negative evaluations especially now in the setting of a normal-appearing EKG and serial troponins flat, no residual chest pain and doubt cardiac ischemia/ACS.  Not consistent with dissection.  I also considered PE.  Her vital signs are normal, but she does not fall under the very low risk category by restratification due to her age.  Lets get a dimer.  I considered respiratory infection given her recent mild cough as well as shortness of breath.  There is no bacterial pneumonia identified on chest x-ray, she does not meet sepsis criteria and certainly looks nontoxic and has no increased work of breathing and no hypoxemia.  We will check viral testing.  She thought maybe this was due to her amitriptyline and she has already discontinued.  Perhaps an adverse effect of this medication, but I doubt overdose that she has taken therapeutic dosing, and her EKG does not show any widening of QRS.  She will consult with her doctor about this medication and whether it is appropriate to restart.  -- Ultimately given the patient's stability and well appearance, I think she can be discharged home pending the results of her VTE evaluation.  All workup is negative and patient is stable.  CT angiogram was negative after positive D-dimer.  Serial troponins flat.  No evidence of bacterial pneumonia.  Discharged with close PMD follow-up.      FINAL CLINICAL IMPRESSION(S) / ED  DIAGNOSES   Final diagnoses:  Nonspecific chest pain  Shortness of breath  Bronchitis     Rx / DC Orders   ED Discharge Orders     None        Note:  This document was prepared using Dragon voice recognition software and may include unintentional dictation errors.    Pilar Jarvis, MD 11/10/23 1610    Pilar Jarvis, MD 11/10/23  0525  

## 2023-11-10 NOTE — Discharge Instructions (Signed)
 Fortunately your evaluation in the Emergency Department did not show any emergency conditions like heart attack, blood clot, or deep lung infections to account for your symptoms.  Thank you for choosing [place] for your health care today!  Please see your primary doctor this week for a follow up appointment.   If you have any new, worsening, or unexpected symptoms call your doctor right away or come back to the emergency department for reevaluation.  It was my pleasure to care for you today.   Daneil Dan Modesto Charon, MD

## 2023-11-12 ENCOUNTER — Telehealth: Payer: Self-pay | Admitting: Cardiovascular Disease

## 2023-11-12 NOTE — Telephone Encounter (Signed)
 Patient called stating she was in the ER over the weekend and they did an EKG on her.  She would like for Dr Mariah Milling to review to EKG to make sure everything a was ok.  She states she saw it on MyChart and was concerned about the questionable change in QRS axis.

## 2023-11-14 NOTE — Telephone Encounter (Signed)
 Called patient and notified her of the following from Dr. Mariah Milling. Looked at everything done in the ER including EKG  It all looks ok  Thx  TGollan   Patient verbalized understanding.

## 2024-03-26 ENCOUNTER — Other Ambulatory Visit: Payer: Self-pay | Admitting: Family Medicine

## 2024-03-26 DIAGNOSIS — Z8249 Family history of ischemic heart disease and other diseases of the circulatory system: Secondary | ICD-10-CM

## 2024-03-31 ENCOUNTER — Other Ambulatory Visit: Payer: Self-pay | Admitting: Family Medicine

## 2024-03-31 DIAGNOSIS — R1013 Epigastric pain: Secondary | ICD-10-CM

## 2024-03-31 DIAGNOSIS — R748 Abnormal levels of other serum enzymes: Secondary | ICD-10-CM

## 2024-03-31 DIAGNOSIS — R112 Nausea with vomiting, unspecified: Secondary | ICD-10-CM

## 2024-04-01 ENCOUNTER — Ambulatory Visit
Admission: RE | Admit: 2024-04-01 | Discharge: 2024-04-01 | Disposition: A | Discharge status: Home / Self Care | Payer: Self-pay | Source: Ambulatory Visit | Attending: Family Medicine | Admitting: Family Medicine

## 2024-04-01 DIAGNOSIS — Z8249 Family history of ischemic heart disease and other diseases of the circulatory system: Secondary | ICD-10-CM | POA: Insufficient documentation

## 2024-07-01 NOTE — Progress Notes (Unsigned)
 Date:  07/04/2024   ID:  Margaret Bryan, DOB Feb 16, 1961, MRN [id number]  Patient Location:  [address] [place] [address]   Provider location:   [facility], [place] office  PCP:  Harvey Gaetana CROME, NP  Cardiologist:  Perla CRIS Nicolas  Chief Complaint  Patient presents with   12 month follow up     Patient c/o shortness of breath & left arm numbness with pushing her granddaughter in a stroller; symptoms for the past 6 months.     History of Present Illness:    Margaret Bryan is a 63 y.o. female  past medical history of chronic pancreatitis,  hyperlipidemia  presenting to the hospital January 08, 2020 with chest pain concerning for angina Cardiac CTA May 2021 no significant coronary disease, calcium score 0 Who presents for follow-up of her chest pain  Last Bryan by myself in clinic October 2024  Active, 5 grandchildren that live locally, 1 on the [address] - Granddaughter 58 years old continues to do competitive rodeo, spending long time on the road  Continues to work, planning on additional 2 to 3 years before potentially retiring  Blood pressure well-controlled on current medications Off amlodipine  for leg swelling, remains on spironolactone  Prior side effects on hydrochlorothiazide   Prior cardiac CTA 2021 with no coronary Calcium, no significant disease  Given family history she had calcium scoring July 2025 Calcium score 63, split between LAD and RCA Images pulled up and reviewed  Non-smoker, no diabetes  Tolerating Lipitor 10, cholesterol 250 down to 190  EKG personally reviewed by myself on todays visit EKG Interpretation Date/Time:  Friday July 04 2024 08:25:42 EDT Ventricular Rate:  66 PR Interval:  140 QRS Duration:  72 QT Interval:  360 QTC Calculation: 377 R Axis:   -5  Text Interpretation: Normal sinus rhythm When compared with ECG of 09-Nov-2023 20:52, Questionable change in QRS axis Confirmed by Perla Lye [phone]) on  07/04/2024 8:34:57 AM   Past medical history reviewed Previous episodes of significant left-sided chest pain radiating to her left jaw left arm Started overnight, woke up in the morning with recurrent symptoms, presented to the fire department EKG nonacute in the hospital Cardiac enzymes negative Markedly hypertensive at EMTs/fire department, high blood pressure persisted for several hours   Cardiac CTA Jan 22, 2020 1. Coronary calcium score of 0. Patient is low risk for near term coronary events 2. Normal coronary origin with right dominance. 3. No evidence of CAD. 4.CAD-RADS 0. Consider non-atherosclerotic causes of chest pain    Past Medical History:  Diagnosis Date   Cancer (HCC)    skin ca   GERD (gastroesophageal reflux disease)    Heart murmur    Migraine    Pancreas divisum    Past Surgical History:  Procedure Laterality Date   ABDOMINAL HYSTERECTOMY     NASAL SINUS SURGERY       Allergies:   Iodinated contrast media, Iodine, Hctz [hydrochlorothiazide ], Potassium, Potassium-containing compounds, Amitriptyline, Gadobenate, Imitrex [sumatriptan], and Percocet [oxycodone -acetaminophen ]   Social History   Tobacco Use   Smoking status: Never   Smokeless tobacco: Never  Vaping Use   Vaping status: Never Used  Substance Use Topics   Alcohol use: No   Drug use: Never     Current Outpatient Medications on File Prior to Visit  Medication Sig Dispense Refill   atorvastatin (LIPITOR) 10 MG tablet Take 10 mg by mouth daily.     azithromycin (ZITHROMAX) 250  MG tablet Take 250 mg by mouth 3 (three) times a week.     Cholecalciferol (D 1000) 25 MCG (1000 UT) capsule Take 1,000 Units by mouth daily.     esomeprazole  (NEXIUM ) 40 MG capsule Take 1 capsule (40 mg total) by mouth daily. 30 capsule 0   fexofenadine  (ALLEGRA) 180 MG tablet Take 180 mg by mouth daily.     fluticasone (FLONASE) 50 MCG/ACT nasal spray Place 2 sprays into both nostrils daily as needed for allergies  or rhinitis.      Magnesium  Oxide 250 MG TABS Take 250 mg by mouth daily.      meclizine  (ANTIVERT ) 25 MG tablet Take 1 tablet (25 mg total) by mouth 3 (three) times daily as needed for dizziness. 30 tablet 0   montelukast (SINGULAIR) 10 MG tablet Take by mouth.     Multiple Vitamins-Minerals (ONE-A-DAY 50 PLUS PO) Take 1 tablet by mouth daily.      nitroGLYCERIN  (NITROSTAT ) 0.4 MG SL tablet Place 1 tablet (0.4 mg total) under the tongue every 5 (five) minutes as needed for chest pain. 30 tablet 0   ondansetron  (ZOFRAN -ODT) 8 MG disintegrating tablet Take 8 mg by mouth every 8 (eight) hours as needed for nausea or vomiting.     PAPAYA PO Take by mouth. Prn for pancreas     predniSONE  (DELTASONE ) 10 MG tablet Take 10 mg by mouth. Taper     ranolazine  (RANEXA ) 500 MG 12 hr tablet Take 1 tablet (500 mg total) by mouth 2 (two) times daily. (Patient taking differently: Take 500 mg by mouth daily.) 180 tablet 3   spironolactone  (ALDACTONE ) 25 MG tablet Take 0.5 tablets (12.5 mg total) by mouth daily. 45 tablet 3   thiamine 100 MG tablet Take by mouth.     topiramate (TOPAMAX) 100 MG tablet Take 100 mg by mouth 2 (two) times daily.     Vitamin E (VITAMIN E/D-ALPHA NATURAL) 268 MG (400 UNIT) CAPS Take 400 Units by mouth daily at 6 (six) AM.     zinc sulfate 220 (50 Zn) MG capsule Take by mouth.     eletriptan (RELPAX) 40 MG tablet Take 40 mg by mouth as needed for migraine or headache. May repeat in 2 hours if headache persists or recurs. (Patient not taking: Reported on 07/04/2024)     meloxicam  (MOBIC ) 15 MG tablet Take 1 tablet (15 mg total) by mouth daily. (Patient not taking: Reported on 07/04/2024) 10 tablet 0   phenazopyridine  (PYRIDIUM ) 200 MG tablet Take 1 tablet (200 mg total) by mouth 3 (three) times daily as needed for pain. (Patient not taking: Reported on 07/04/2024) 6 tablet 0   senna (SENOKOT) 8.6 MG tablet Take 1 tablet by mouth daily.     tiZANidine  (ZANAFLEX ) 4 MG tablet Take 1 tablet  (4 mg total) by mouth every 8 (eight) hours as needed for muscle spasms. (Patient not taking: Reported on 07/04/2024) 30 tablet 0   No current facility-administered medications on file prior to visit.     Family Hx: The patient's family history includes Breast cancer (age of onset: 2) in her cousin; Breast cancer (age of onset: 47) in her maternal aunt; Breast cancer (age of onset: 68) in her maternal grandmother; Esophageal cancer in her mother; Heart failure in her father and mother; Hypertension in her father and mother.  ROS:   Please see the history of present illness.    Review of Systems  Constitutional: Negative.   HENT: Negative.  Respiratory: Negative.    Cardiovascular:  Positive for chest pain.  Gastrointestinal: Negative.   Musculoskeletal: Negative.   Neurological: Negative.   Psychiatric/Behavioral: Negative.    All other systems reviewed and are negative.    Labs/Other Tests and Data Reviewed:    Recent Labs: 11/09/2023: BUN 17; Creatinine, Ser 0.75; Hemoglobin 13.2; Platelets 229; Potassium 3.6; Sodium 139 11/10/2023: ALT 17   Recent Lipid Panel No results found for: CHOL, TRIG, HDL, CHOLHDL, LDLCALC, LDLDIRECT  Wt Readings from Last 3 Encounters:  07/04/24 185 lb 6 oz (84.1 kg)  11/09/23 179 lb (81.2 kg)  07/03/23 180 lb 2 oz (81.7 kg)     Exam:    Vital Signs: Vital signs may also be detailed in the HPI BP 120/64 (BP Location: Left Arm, Patient Position: Sitting, Cuff Size: Normal)   Pulse 66   Ht 5' 8 (1.727 m)   Wt 185 lb 6 oz (84.1 kg)   SpO2 98%   BMI 28.19 kg/m   Constitutional:  oriented to person, place, and time. No distress.  HENT:  Head: Grossly normal Eyes:  no discharge. No scleral icterus.  Neck: No JVD, no carotid bruits  Cardiovascular: Regular rate and rhythm, no murmurs appreciated Pulmonary/Chest: Clear to auscultation bilaterally, no wheezes or rails Abdominal: Soft.  no distension.  no tenderness.   Musculoskeletal: Normal range of motion Neurological:  normal muscle tone. Coordination normal. No atrophy Skin: Skin warm and dry Psychiatric: normal affect, pleasant  ASSESSMENT & PLAN:    Problem List Items Addressed This Visit     Chest pain - Primary   Relevant Orders   EKG 12-Lead (Completed)   Other Visit Diagnoses       DOE (dyspnea on exertion)       Relevant Orders   EKG 12-Lead (Completed)     Leg swelling         Essential hypertension       Relevant Orders   EKG 12-Lead (Completed)     Hyperlipidemia, unspecified hyperlipidemia type       Relevant Orders   Lipid panel      Coronary calcification Low calcium score, images pulled up and reviewed -Rare chest discomfort, atypical in nature - Recommend she increase Lipitor up to 10 mg daily - Goal total cholesterol less than 150 Consider higher dose Lipitor or add Zetia in the future Reports prior myalgias on other statin, possibly Crestor  Hyperlipidemia Management as above will increase Lipitor up to 10 daily, recheck lipids, consider Zetia  Leg swelling Resolved off calcium channel blocker   Essential hypertension Continue spironolactone , numbers well-controlled  Chronic pancreatitis Stable symptoms Followed by GI, prior intervention at [facility]    Signed, Evalene Lunger, MD  [facility] [address], [place], [place] [address]

## 2024-07-04 ENCOUNTER — Encounter: Payer: Self-pay | Admitting: Cardiovascular Disease

## 2024-07-04 ENCOUNTER — Ambulatory Visit: Attending: Cardiovascular Disease | Admitting: Cardiovascular Disease

## 2024-07-04 VITALS — BP 120/64 | HR 66 | Ht 68.0 in | Wt 185.4 lb

## 2024-07-04 DIAGNOSIS — R0609 Other forms of dyspnea: Secondary | ICD-10-CM

## 2024-07-04 DIAGNOSIS — M7989 Other specified soft tissue disorders: Secondary | ICD-10-CM | POA: Diagnosis not present

## 2024-07-04 DIAGNOSIS — I10 Essential (primary) hypertension: Secondary | ICD-10-CM

## 2024-07-04 DIAGNOSIS — R079 Chest pain, unspecified: Secondary | ICD-10-CM

## 2024-07-04 DIAGNOSIS — E785 Hyperlipidemia, unspecified: Secondary | ICD-10-CM

## 2024-07-04 NOTE — Patient Instructions (Addendum)
 Medication Instructions:   Atorvastatin 10 mg daily  If you need a refill on your cardiac medications before your next appointment, please call your pharmacy.   Lab work: Your provider would like for you to return in 3 months to have the following labs drawn: Lipids.   Please go to [facility] [address] ([facility] [address], [place] [address] You do not need an appointment.  They are open from 8 am- 4:30 pm.  Lunch from 1:00 pm- 2:00 pm You will need to be fasting.  Testing/Procedures: No new testing needed  Follow-Up: At [facility], you and your health needs are our priority.  As part of our continuing mission to provide you with exceptional heart care, we have created designated Provider Care Teams.  These Care Teams include your primary Cardiologist (physician) and Advanced Practice Providers (APPs -  Physician Assistants and Nurse Practitioners) who all work together to provide you with the care you need, when you need it.  You will need a follow up appointment in 12 months  Providers on your designated Care Team:   Lonni Meager, NP Bernardino Bring, PA-C Cadence [place], [place]  COVID-19 Vaccine Information can be found at: [email or web address] For questions related to vaccine distribution or appointments, please email [email or web address] or call [phone].

## 2024-07-06 ENCOUNTER — Encounter: Payer: Self-pay | Admitting: Cardiovascular Disease

## 2024-07-07 MED ORDER — SPIRONOLACTONE 25 MG PO TABS: 12.5000 mg | ORAL_TABLET | Freq: Every day | ORAL | 3 refills | Status: AC

## 2024-09-25 ENCOUNTER — Other Ambulatory Visit: Payer: Self-pay | Admitting: Family Medicine

## 2024-09-25 DIAGNOSIS — Z1231 Encounter for screening mammogram for malignant neoplasm of breast: Secondary | ICD-10-CM

## 2024-10-01 ENCOUNTER — Other Ambulatory Visit: Payer: Self-pay | Admitting: Medical Genetics

## 2024-10-02 ENCOUNTER — Other Ambulatory Visit
Admission: RE | Admit: 2024-10-02 | Discharge: 2024-10-02 | Disposition: A | Discharge status: Home / Self Care | Payer: Self-pay | Source: Ambulatory Visit | Attending: Medical Genetics | Admitting: Medical Genetics

## 2024-10-08 ENCOUNTER — Encounter

## 2024-10-12 LAB — GENECONNECT MOLECULAR SCREEN: Genetic Analysis Overall Interpretation: NEGATIVE

## 2024-10-22 ENCOUNTER — Encounter
